# Patient Record
Sex: Female | Born: 1935 | Race: White | Hispanic: No
Health system: Southern US, Community
[De-identification: ages and names within clinical notes are randomized; demographics above are authoritative.]

---

## 2004-12-14 ENCOUNTER — Ambulatory Visit: Payer: Self-pay | Admitting: Internal Medicine

## 2005-12-22 ENCOUNTER — Ambulatory Visit: Payer: Self-pay | Admitting: Internal Medicine

## 2006-06-06 ENCOUNTER — Ambulatory Visit: Payer: Self-pay | Admitting: Unknown Physician Specialty

## 2006-12-24 ENCOUNTER — Ambulatory Visit: Payer: Self-pay | Admitting: Internal Medicine

## 2007-01-26 ENCOUNTER — Emergency Department: Payer: Self-pay | Admitting: Emergency Medicine

## 2008-08-20 ENCOUNTER — Ambulatory Visit: Payer: Self-pay | Admitting: Internal Medicine

## 2008-08-25 ENCOUNTER — Ambulatory Visit: Payer: Self-pay | Admitting: Internal Medicine

## 2008-09-08 ENCOUNTER — Ambulatory Visit: Payer: Self-pay | Admitting: Internal Medicine

## 2009-09-02 ENCOUNTER — Ambulatory Visit: Payer: Self-pay | Admitting: Internal Medicine

## 2009-12-15 ENCOUNTER — Ambulatory Visit: Payer: Self-pay | Admitting: Obstetrics & Gynecology

## 2009-12-23 ENCOUNTER — Ambulatory Visit: Payer: Self-pay | Admitting: Obstetrics & Gynecology

## 2009-12-28 ENCOUNTER — Ambulatory Visit: Payer: Self-pay | Admitting: Gynecologic Oncology

## 2010-01-11 ENCOUNTER — Ambulatory Visit: Payer: Self-pay | Admitting: Gynecologic Oncology

## 2010-01-28 ENCOUNTER — Ambulatory Visit: Payer: Self-pay | Admitting: Gynecologic Oncology

## 2010-01-31 ENCOUNTER — Ambulatory Visit: Payer: Self-pay | Admitting: Obstetrics & Gynecology

## 2010-02-08 ENCOUNTER — Inpatient Hospital Stay: Payer: Self-pay | Admitting: Obstetrics & Gynecology

## 2010-02-22 ENCOUNTER — Ambulatory Visit: Payer: Self-pay | Admitting: Gynecologic Oncology

## 2010-02-27 ENCOUNTER — Ambulatory Visit: Payer: Self-pay | Admitting: Gynecologic Oncology

## 2010-02-27 ENCOUNTER — Ambulatory Visit: Payer: Self-pay | Admitting: Internal Medicine

## 2010-03-08 ENCOUNTER — Ambulatory Visit: Payer: Self-pay | Admitting: Vascular Surgery

## 2010-03-30 ENCOUNTER — Ambulatory Visit: Payer: Self-pay | Admitting: Internal Medicine

## 2010-03-30 ENCOUNTER — Ambulatory Visit: Payer: Self-pay | Admitting: Gynecologic Oncology

## 2010-04-29 ENCOUNTER — Ambulatory Visit: Payer: Self-pay | Admitting: Internal Medicine

## 2010-04-29 ENCOUNTER — Ambulatory Visit: Payer: Self-pay | Admitting: Gynecologic Oncology

## 2010-05-30 ENCOUNTER — Ambulatory Visit: Payer: Self-pay | Admitting: Gynecologic Oncology

## 2010-05-30 ENCOUNTER — Ambulatory Visit: Payer: Self-pay | Admitting: Internal Medicine

## 2010-06-30 ENCOUNTER — Ambulatory Visit: Payer: Self-pay | Admitting: Gynecologic Oncology

## 2010-06-30 ENCOUNTER — Ambulatory Visit: Payer: Self-pay | Admitting: Internal Medicine

## 2010-07-20 LAB — CA 125: CA 125: 12 U/mL (ref 0.0–34.0)

## 2010-07-30 ENCOUNTER — Ambulatory Visit: Payer: Self-pay | Admitting: Gynecologic Oncology

## 2010-07-30 ENCOUNTER — Ambulatory Visit: Payer: Self-pay | Admitting: Internal Medicine

## 2010-08-30 ENCOUNTER — Ambulatory Visit: Payer: Self-pay | Admitting: Internal Medicine

## 2010-08-30 ENCOUNTER — Ambulatory Visit: Payer: Self-pay | Admitting: Gynecologic Oncology

## 2010-09-29 ENCOUNTER — Ambulatory Visit: Payer: Self-pay | Admitting: Internal Medicine

## 2010-09-29 ENCOUNTER — Ambulatory Visit: Payer: Self-pay | Admitting: Gynecologic Oncology

## 2010-10-07 ENCOUNTER — Ambulatory Visit: Payer: Self-pay | Admitting: Internal Medicine

## 2010-10-30 ENCOUNTER — Ambulatory Visit: Payer: Self-pay | Admitting: Gynecologic Oncology

## 2010-10-30 ENCOUNTER — Ambulatory Visit: Payer: Self-pay | Admitting: Internal Medicine

## 2010-11-30 ENCOUNTER — Ambulatory Visit: Payer: Self-pay | Admitting: Internal Medicine

## 2010-11-30 ENCOUNTER — Ambulatory Visit: Payer: Self-pay | Admitting: Gynecologic Oncology

## 2010-12-29 ENCOUNTER — Ambulatory Visit: Payer: Self-pay | Admitting: Internal Medicine

## 2010-12-29 ENCOUNTER — Ambulatory Visit: Payer: Self-pay | Admitting: Gynecologic Oncology

## 2011-01-14 LAB — CA 125: CA 125: 13.1 U/mL (ref 0.0–34.0)

## 2011-01-29 ENCOUNTER — Ambulatory Visit: Payer: Self-pay | Admitting: Gynecologic Oncology

## 2011-01-29 ENCOUNTER — Ambulatory Visit: Payer: Self-pay | Admitting: Internal Medicine

## 2011-02-28 ENCOUNTER — Ambulatory Visit: Payer: Self-pay | Admitting: Gynecologic Oncology

## 2011-02-28 ENCOUNTER — Ambulatory Visit: Payer: Self-pay | Admitting: Internal Medicine

## 2011-03-31 ENCOUNTER — Ambulatory Visit: Payer: Self-pay | Admitting: Gynecologic Oncology

## 2011-05-17 ENCOUNTER — Ambulatory Visit: Payer: Self-pay | Admitting: Internal Medicine

## 2011-05-18 LAB — CA 125: CA 125: 11.8 U/mL (ref 0.0–34.0)

## 2011-05-31 ENCOUNTER — Ambulatory Visit: Payer: Self-pay | Admitting: Internal Medicine

## 2011-07-11 ENCOUNTER — Ambulatory Visit: Payer: Self-pay | Admitting: Internal Medicine

## 2011-07-31 ENCOUNTER — Ambulatory Visit: Payer: Self-pay | Admitting: Internal Medicine

## 2011-10-05 ENCOUNTER — Ambulatory Visit: Payer: Self-pay | Admitting: Internal Medicine

## 2011-10-31 ENCOUNTER — Ambulatory Visit: Payer: Self-pay | Admitting: Internal Medicine

## 2011-11-22 LAB — COMPREHENSIVE METABOLIC PANEL
Albumin: 3.4 g/dL (ref 3.4–5.0)
Alkaline Phosphatase: 108 U/L (ref 50–136)
Anion Gap: 7 (ref 7–16)
Calcium, Total: 8.9 mg/dL (ref 8.5–10.1)
Chloride: 105 mmol/L (ref 98–107)
Co2: 30 mmol/L (ref 21–32)
Creatinine: 0.91 mg/dL (ref 0.60–1.30)
Glucose: 150 mg/dL — ABNORMAL HIGH (ref 65–99)
SGOT(AST): 16 U/L (ref 15–37)
Sodium: 142 mmol/L (ref 136–145)
Total Protein: 6.6 g/dL (ref 6.4–8.2)

## 2011-11-22 LAB — CBC CANCER CENTER
Basophil %: 0.5 %
Eosinophil #: 0.1 x10 3/mm (ref 0.0–0.7)
Eosinophil %: 1.9 %
HGB: 12.8 g/dL (ref 12.0–16.0)
MCH: 30.4 pg (ref 26.0–34.0)
MCV: 88 fL (ref 80–100)
Monocyte #: 0.4 x10 3/mm (ref 0.0–0.7)
Monocyte %: 10.1 %
Neutrophil %: 67.6 %
Platelet: 196 x10 3/mm (ref 150–440)
RBC: 4.21 10*6/uL (ref 3.80–5.20)
WBC: 3.5 x10 3/mm — ABNORMAL LOW (ref 3.6–11.0)

## 2011-11-24 LAB — CA 125: CA 125: 10.5 U/mL (ref 0.0–34.0)

## 2011-12-01 ENCOUNTER — Ambulatory Visit: Payer: Self-pay | Admitting: Internal Medicine

## 2012-01-05 ENCOUNTER — Ambulatory Visit: Payer: Self-pay | Admitting: Internal Medicine

## 2012-01-05 ENCOUNTER — Ambulatory Visit: Payer: Self-pay

## 2012-01-25 ENCOUNTER — Other Ambulatory Visit: Payer: Self-pay | Admitting: Diagnostic Radiology

## 2012-01-25 LAB — CREATININE, SERUM
Creatinine: 0.88 mg/dL (ref 0.60–1.30)
EGFR (African American): >60
EGFR (Non-African Amer.): >60

## 2012-01-29 ENCOUNTER — Ambulatory Visit: Payer: Self-pay | Admitting: Internal Medicine

## 2012-02-07 LAB — CA 125: CA 125: 14.6 U/mL (ref 0.0–34.0)

## 2012-02-28 ENCOUNTER — Ambulatory Visit: Payer: Self-pay | Admitting: Radiation Oncology

## 2012-02-28 ENCOUNTER — Ambulatory Visit: Payer: Self-pay | Admitting: Internal Medicine

## 2012-03-30 ENCOUNTER — Ambulatory Visit: Payer: Self-pay | Admitting: Internal Medicine

## 2012-03-30 ENCOUNTER — Ambulatory Visit: Payer: Self-pay

## 2012-05-10 ENCOUNTER — Ambulatory Visit: Payer: Self-pay | Admitting: Internal Medicine

## 2012-05-15 LAB — CA 125: CA 125: 16.2 U/mL (ref 0.0–34.0)

## 2012-05-30 ENCOUNTER — Ambulatory Visit: Payer: Self-pay | Admitting: Internal Medicine

## 2012-06-30 ENCOUNTER — Ambulatory Visit: Payer: Self-pay | Admitting: Internal Medicine

## 2012-07-04 ENCOUNTER — Ambulatory Visit: Payer: Self-pay | Admitting: Unknown Physician Specialty

## 2012-08-13 ENCOUNTER — Ambulatory Visit: Payer: Self-pay | Admitting: Internal Medicine

## 2012-08-14 LAB — CA 125: CA 125: 16 U/mL (ref 0.0–34.0)

## 2012-08-21 ENCOUNTER — Ambulatory Visit: Payer: Self-pay | Admitting: Vascular Surgery

## 2012-08-30 ENCOUNTER — Ambulatory Visit: Payer: Self-pay | Admitting: Internal Medicine

## 2012-09-29 ENCOUNTER — Ambulatory Visit: Payer: Self-pay | Admitting: Internal Medicine

## 2012-12-16 ENCOUNTER — Ambulatory Visit: Payer: Self-pay | Admitting: Gynecologic Oncology

## 2012-12-28 ENCOUNTER — Ambulatory Visit: Payer: Self-pay | Admitting: Gynecologic Oncology

## 2013-03-05 ENCOUNTER — Ambulatory Visit: Payer: Self-pay | Admitting: Family Medicine

## 2013-03-17 ENCOUNTER — Ambulatory Visit: Payer: Self-pay | Admitting: Gynecologic Oncology

## 2013-03-19 LAB — CA 125: CA 125: 19.9 U/mL (ref 0.0–34.0)

## 2013-03-30 ENCOUNTER — Ambulatory Visit: Payer: Self-pay | Admitting: Gynecologic Oncology

## 2013-09-23 ENCOUNTER — Ambulatory Visit: Payer: Self-pay | Admitting: Gynecologic Oncology

## 2013-09-29 ENCOUNTER — Ambulatory Visit: Payer: Self-pay | Admitting: Gynecologic Oncology

## 2013-10-30 ENCOUNTER — Ambulatory Visit: Payer: Self-pay | Admitting: Internal Medicine

## 2013-11-16 ENCOUNTER — Emergency Department: Payer: Self-pay | Admitting: Emergency Medicine

## 2013-11-16 LAB — TROPONIN I: Troponin-I: <0.02 ng/mL

## 2013-11-16 LAB — CBC WITH DIFFERENTIAL/PLATELET
Basophil #: 0.1 10*3/uL (ref 0.0–0.1)
Basophil %: 0.5 %
EOS PCT: 0.3 %
Eosinophil #: 0 10*3/uL (ref 0.0–0.7)
HCT: 42.7 % (ref 35.0–47.0)
HGB: 14.7 g/dL (ref 12.0–16.0)
LYMPHS ABS: 0.5 10*3/uL — AB (ref 1.0–3.6)
LYMPHS PCT: 4.9 %
MCH: 29.7 pg (ref 26.0–34.0)
MCHC: 34.4 g/dL (ref 32.0–36.0)
MCV: 86 fL (ref 80–100)
MONO ABS: 0.7 x10 3/mm (ref 0.2–0.9)
MONOS PCT: 6.4 %
NEUTROS PCT: 87.9 %
Neutrophil #: 9.4 10*3/uL — ABNORMAL HIGH (ref 1.4–6.5)
Platelet: 217 10*3/uL (ref 150–440)
RBC: 4.95 10*6/uL (ref 3.80–5.20)
RDW: 13.6 % (ref 11.5–14.5)
WBC: 10.7 10*3/uL (ref 3.6–11.0)

## 2013-11-16 LAB — COMPREHENSIVE METABOLIC PANEL
ANION GAP: 7 (ref 7–16)
Albumin: 3.6 g/dL (ref 3.4–5.0)
Alkaline Phosphatase: 99 U/L
BUN: 15 mg/dL (ref 7–18)
Bilirubin,Total: 0.8 mg/dL (ref 0.2–1.0)
CALCIUM: 8.9 mg/dL (ref 8.5–10.1)
Chloride: 103 mmol/L (ref 98–107)
Co2: 25 mmol/L (ref 21–32)
Creatinine: 0.78 mg/dL (ref 0.60–1.30)
EGFR (African American): >60
Glucose: 127 mg/dL — ABNORMAL HIGH (ref 65–99)
OSMOLALITY: 273 (ref 275–301)
Potassium: 3.5 mmol/L (ref 3.5–5.1)
SGOT(AST): 27 U/L (ref 15–37)
SGPT (ALT): 20 U/L (ref 12–78)
Sodium: 135 mmol/L — ABNORMAL LOW (ref 136–145)
TOTAL PROTEIN: 7.2 g/dL (ref 6.4–8.2)

## 2013-11-16 LAB — URINALYSIS, COMPLETE
Glucose,UR: 50 mg/dL (ref 0–75)
Hyaline Cast: 18
Leukocyte Esterase: NEGATIVE
Nitrite: NEGATIVE
PH: 6 (ref 4.5–8.0)
RBC,UR: 2 /HPF (ref 0–5)
SPECIFIC GRAVITY: 1.035 (ref 1.003–1.030)
Squamous Epithelial: 2
WBC UR: 7 /HPF (ref 0–5)

## 2013-11-18 LAB — URINALYSIS, COMPLETE
Bilirubin,UR: NEGATIVE
GLUCOSE, UR: NEGATIVE mg/dL (ref 0–75)
Nitrite: NEGATIVE
Ph: 5 (ref 4.5–8.0)
Protein: 30
Specific Gravity: 1.023 (ref 1.003–1.030)
Squamous Epithelial: 1
WBC UR: 3 /HPF (ref 0–5)

## 2013-11-18 LAB — CBC WITH DIFFERENTIAL/PLATELET
BASOS ABS: 0 10*3/uL (ref 0.0–0.1)
Basophil %: 0.4 %
EOS ABS: 0 10*3/uL (ref 0.0–0.7)
EOS PCT: 0.2 %
HCT: 44.4 % (ref 35.0–47.0)
HGB: 14.9 g/dL (ref 12.0–16.0)
LYMPHS PCT: 9.8 %
Lymphocyte #: 0.9 10*3/uL — ABNORMAL LOW (ref 1.0–3.6)
MCH: 29.5 pg (ref 26.0–34.0)
MCHC: 33.6 g/dL (ref 32.0–36.0)
MCV: 88 fL (ref 80–100)
Monocyte #: 0.8 x10 3/mm (ref 0.2–0.9)
Monocyte %: 8.5 %
Neutrophil #: 7.3 10*3/uL — ABNORMAL HIGH (ref 1.4–6.5)
Neutrophil %: 81.1 %
Platelet: 220 10*3/uL (ref 150–440)
RBC: 5.06 10*6/uL (ref 3.80–5.20)
RDW: 14 % (ref 11.5–14.5)
WBC: 9 10*3/uL (ref 3.6–11.0)

## 2013-11-18 LAB — LIPASE, BLOOD: Lipase: 70 U/L — ABNORMAL LOW (ref 73–393)

## 2013-11-18 LAB — COMPREHENSIVE METABOLIC PANEL
ALK PHOS: 105 U/L
ANION GAP: 7 (ref 7–16)
AST: 21 U/L (ref 15–37)
Albumin: 3.8 g/dL (ref 3.4–5.0)
BILIRUBIN TOTAL: 0.7 mg/dL (ref 0.2–1.0)
BUN: 11 mg/dL (ref 7–18)
CO2: 30 mmol/L (ref 21–32)
CREATININE: 0.78 mg/dL (ref 0.60–1.30)
Calcium, Total: 9.8 mg/dL (ref 8.5–10.1)
Chloride: 99 mmol/L (ref 98–107)
EGFR (Non-African Amer.): >60
Glucose: 135 mg/dL — ABNORMAL HIGH (ref 65–99)
Osmolality: 273 (ref 275–301)
POTASSIUM: 4 mmol/L (ref 3.5–5.1)
SGPT (ALT): 17 U/L (ref 12–78)
SODIUM: 136 mmol/L (ref 136–145)
Total Protein: 7.8 g/dL (ref 6.4–8.2)

## 2013-11-20 ENCOUNTER — Inpatient Hospital Stay: Payer: Self-pay | Admitting: Family Medicine

## 2013-11-20 LAB — CBC WITH DIFFERENTIAL/PLATELET
BASOS PCT: 0.4 %
Basophil #: 0 10*3/uL (ref 0.0–0.1)
EOS ABS: 0 10*3/uL (ref 0.0–0.7)
EOS PCT: 0.5 %
HCT: 37.6 % (ref 35.0–47.0)
HGB: 12.7 g/dL (ref 12.0–16.0)
Lymphocyte #: 0.8 10*3/uL — ABNORMAL LOW (ref 1.0–3.6)
Lymphocyte %: 18.5 %
MCH: 29.5 pg (ref 26.0–34.0)
MCHC: 33.7 g/dL (ref 32.0–36.0)
MCV: 88 fL (ref 80–100)
MONOS PCT: 13.3 %
Monocyte #: 0.6 x10 3/mm (ref 0.2–0.9)
Neutrophil #: 3 10*3/uL (ref 1.4–6.5)
Neutrophil %: 67.3 %
PLATELETS: 173 10*3/uL (ref 150–440)
RBC: 4.3 10*6/uL (ref 3.80–5.20)
RDW: 13.8 % (ref 11.5–14.5)
WBC: 4.5 10*3/uL (ref 3.6–11.0)

## 2013-11-20 LAB — BASIC METABOLIC PANEL
ANION GAP: 7 (ref 7–16)
BUN: 10 mg/dL (ref 7–18)
Calcium, Total: 8.6 mg/dL (ref 8.5–10.1)
Chloride: 103 mmol/L (ref 98–107)
Co2: 26 mmol/L (ref 21–32)
Creatinine: 0.62 mg/dL (ref 0.60–1.30)
EGFR (African American): >60
Glucose: 56 mg/dL — ABNORMAL LOW (ref 65–99)
Osmolality: 269 (ref 275–301)
Potassium: 3.6 mmol/L (ref 3.5–5.1)
Sodium: 136 mmol/L (ref 136–145)

## 2013-11-20 LAB — MAGNESIUM: Magnesium: 1.8 mg/dL

## 2013-11-20 LAB — CEA: CEA: 3.3 ng/mL (ref 0.0–4.7)

## 2013-11-22 LAB — CBC WITH DIFFERENTIAL/PLATELET
BASOS PCT: 0.2 %
Basophil #: 0 10*3/uL (ref 0.0–0.1)
EOS ABS: 0 10*3/uL (ref 0.0–0.7)
EOS PCT: 0 %
HCT: 38.7 % (ref 35.0–47.0)
HGB: 13 g/dL (ref 12.0–16.0)
LYMPHS ABS: 0.5 10*3/uL — AB (ref 1.0–3.6)
Lymphocyte %: 6.3 %
MCH: 29.5 pg (ref 26.0–34.0)
MCHC: 33.6 g/dL (ref 32.0–36.0)
MCV: 88 fL (ref 80–100)
Monocyte #: 0.9 x10 3/mm (ref 0.2–0.9)
Monocyte %: 10.3 %
NEUTROS ABS: 6.9 10*3/uL — AB (ref 1.4–6.5)
Neutrophil %: 83.2 %
Platelet: 179 10*3/uL (ref 150–440)
RBC: 4.41 10*6/uL (ref 3.80–5.20)
RDW: 13.8 % (ref 11.5–14.5)
WBC: 8.3 10*3/uL (ref 3.6–11.0)

## 2013-11-22 LAB — BASIC METABOLIC PANEL
ANION GAP: 3 — AB (ref 7–16)
BUN: 7 mg/dL (ref 7–18)
CO2: 29 mmol/L (ref 21–32)
Calcium, Total: 8.5 mg/dL (ref 8.5–10.1)
Chloride: 103 mmol/L (ref 98–107)
Creatinine: 0.81 mg/dL (ref 0.60–1.30)
EGFR (African American): >60
EGFR (Non-African Amer.): >60
Glucose: 204 mg/dL — ABNORMAL HIGH (ref 65–99)
Osmolality: 274 (ref 275–301)
Potassium: 4.2 mmol/L (ref 3.5–5.1)
Sodium: 135 mmol/L — ABNORMAL LOW (ref 136–145)

## 2013-11-22 LAB — CA 125: CA 125: 113.7 U/mL — ABNORMAL HIGH (ref 0.0–34.0)

## 2013-11-23 LAB — CBC WITH DIFFERENTIAL/PLATELET
Basophil #: 0 10*3/uL (ref 0.0–0.1)
Basophil %: 0.3 %
EOS ABS: 0 10*3/uL (ref 0.0–0.7)
Eosinophil %: 0.6 %
HCT: 36.1 % (ref 35.0–47.0)
HGB: 12.2 g/dL (ref 12.0–16.0)
Lymphocyte #: 0.7 10*3/uL — ABNORMAL LOW (ref 1.0–3.6)
Lymphocyte %: 13.1 %
MCH: 29.7 pg (ref 26.0–34.0)
MCHC: 33.8 g/dL (ref 32.0–36.0)
MCV: 88 fL (ref 80–100)
Monocyte #: 0.6 x10 3/mm (ref 0.2–0.9)
Monocyte %: 12.3 %
Neutrophil #: 3.7 10*3/uL (ref 1.4–6.5)
Neutrophil %: 73.7 %
Platelet: 176 10*3/uL (ref 150–440)
RBC: 4.12 10*6/uL (ref 3.80–5.20)
RDW: 14.2 % (ref 11.5–14.5)
WBC: 5 10*3/uL (ref 3.6–11.0)

## 2013-11-23 LAB — BASIC METABOLIC PANEL
ANION GAP: 3 — AB (ref 7–16)
BUN: 6 mg/dL — ABNORMAL LOW (ref 7–18)
CO2: 29 mmol/L (ref 21–32)
Calcium, Total: 8.6 mg/dL (ref 8.5–10.1)
Chloride: 102 mmol/L (ref 98–107)
Creatinine: 0.72 mg/dL (ref 0.60–1.30)
EGFR (Non-African Amer.): >60
GLUCOSE: 165 mg/dL — AB (ref 65–99)
Osmolality: 270 (ref 275–301)
Potassium: 4 mmol/L (ref 3.5–5.1)
Sodium: 134 mmol/L — ABNORMAL LOW (ref 136–145)

## 2013-11-25 LAB — CBC WITH DIFFERENTIAL/PLATELET
BASOS ABS: 0 10*3/uL (ref 0.0–0.1)
BASOS PCT: 0.1 %
Eosinophil #: 0.1 10*3/uL (ref 0.0–0.7)
Eosinophil %: 2.7 %
HCT: 38.2 % (ref 35.0–47.0)
HGB: 12.8 g/dL (ref 12.0–16.0)
Lymphocyte #: 0.4 10*3/uL — ABNORMAL LOW (ref 1.0–3.6)
Lymphocyte %: 7.1 %
MCH: 29.7 pg (ref 26.0–34.0)
MCHC: 33.5 g/dL (ref 32.0–36.0)
MCV: 89 fL (ref 80–100)
MONO ABS: 0.6 x10 3/mm (ref 0.2–0.9)
Monocyte %: 10.4 %
NEUTROS PCT: 79.7 %
Neutrophil #: 4.3 10*3/uL (ref 1.4–6.5)
Platelet: 199 10*3/uL (ref 150–440)
RBC: 4.31 10*6/uL (ref 3.80–5.20)
RDW: 14 % (ref 11.5–14.5)
WBC: 5.5 10*3/uL (ref 3.6–11.0)

## 2013-11-25 LAB — BASIC METABOLIC PANEL
Anion Gap: 2 — ABNORMAL LOW (ref 7–16)
BUN: 7 mg/dL (ref 7–18)
CREATININE: 0.74 mg/dL (ref 0.60–1.30)
Calcium, Total: 8.6 mg/dL (ref 8.5–10.1)
Chloride: 99 mmol/L (ref 98–107)
Co2: 32 mmol/L (ref 21–32)
EGFR (Non-African Amer.): >60
Glucose: 169 mg/dL — ABNORMAL HIGH (ref 65–99)
OSMOLALITY: 268 (ref 275–301)
Potassium: 3.7 mmol/L (ref 3.5–5.1)
Sodium: 133 mmol/L — ABNORMAL LOW (ref 136–145)

## 2013-11-26 LAB — BASIC METABOLIC PANEL
Anion Gap: 3 — ABNORMAL LOW (ref 7–16)
BUN: 6 mg/dL — ABNORMAL LOW (ref 7–18)
CALCIUM: 9.1 mg/dL (ref 8.5–10.1)
CHLORIDE: 101 mmol/L (ref 98–107)
Co2: 30 mmol/L (ref 21–32)
Creatinine: 0.67 mg/dL (ref 0.60–1.30)
EGFR (African American): >60
Glucose: 159 mg/dL — ABNORMAL HIGH (ref 65–99)
OSMOLALITY: 269 (ref 275–301)
Potassium: 3.9 mmol/L (ref 3.5–5.1)
Sodium: 134 mmol/L — ABNORMAL LOW (ref 136–145)

## 2013-11-28 LAB — CBC WITH DIFFERENTIAL/PLATELET
Basophil #: 0 10*3/uL (ref 0.0–0.1)
Basophil %: 0.5 %
Eosinophil #: 0.2 10*3/uL (ref 0.0–0.7)
Eosinophil %: 2.9 %
HCT: 38.7 % (ref 35.0–47.0)
HGB: 13.2 g/dL (ref 12.0–16.0)
Lymphocyte #: 0.7 10*3/uL — ABNORMAL LOW (ref 1.0–3.6)
Lymphocyte %: 12.9 %
MCH: 29.8 pg (ref 26.0–34.0)
MCHC: 34 g/dL (ref 32.0–36.0)
MCV: 88 fL (ref 80–100)
Monocyte #: 0.6 x10 3/mm (ref 0.2–0.9)
Monocyte %: 12 %
NEUTROS ABS: 3.9 10*3/uL (ref 1.4–6.5)
Neutrophil %: 71.7 %
Platelet: 204 10*3/uL (ref 150–440)
RBC: 4.41 10*6/uL (ref 3.80–5.20)
RDW: 13.6 % (ref 11.5–14.5)
WBC: 5.4 10*3/uL (ref 3.6–11.0)

## 2013-11-28 LAB — BASIC METABOLIC PANEL
ANION GAP: 5 — AB (ref 7–16)
BUN: 12 mg/dL (ref 7–18)
Calcium, Total: 9.3 mg/dL (ref 8.5–10.1)
Chloride: 99 mmol/L (ref 98–107)
Co2: 29 mmol/L (ref 21–32)
Creatinine: 0.74 mg/dL (ref 0.60–1.30)
EGFR (African American): >60
GLUCOSE: 171 mg/dL — AB (ref 65–99)
Osmolality: 270 (ref 275–301)
Potassium: 3.8 mmol/L (ref 3.5–5.1)
Sodium: 133 mmol/L — ABNORMAL LOW (ref 136–145)

## 2013-12-04 ENCOUNTER — Ambulatory Visit: Payer: Self-pay | Admitting: Internal Medicine

## 2013-12-09 ENCOUNTER — Ambulatory Visit: Payer: Self-pay | Admitting: Surgery

## 2013-12-11 LAB — CBC CANCER CENTER
Basophil #: 0 x10 3/mm (ref 0.0–0.1)
Basophil %: 0.3 %
EOS ABS: 0.1 x10 3/mm (ref 0.0–0.7)
Eosinophil %: 1.9 %
HCT: 36.4 % (ref 35.0–47.0)
HGB: 11.8 g/dL — AB (ref 12.0–16.0)
LYMPHS ABS: 0.6 x10 3/mm — AB (ref 1.0–3.6)
Lymphocyte %: 13.3 %
MCH: 28.6 pg (ref 26.0–34.0)
MCHC: 32.5 g/dL (ref 32.0–36.0)
MCV: 88 fL (ref 80–100)
MONOS PCT: 8 %
Monocyte #: 0.4 x10 3/mm (ref 0.2–0.9)
Neutrophil #: 3.6 x10 3/mm (ref 1.4–6.5)
Neutrophil %: 76.5 %
PLATELETS: 218 x10 3/mm (ref 150–440)
RBC: 4.14 10*6/uL (ref 3.80–5.20)
RDW: 14.2 % (ref 11.5–14.5)
WBC: 4.7 x10 3/mm (ref 3.6–11.0)

## 2013-12-11 LAB — COMPREHENSIVE METABOLIC PANEL
ALBUMIN: 2.8 g/dL — AB (ref 3.4–5.0)
ALK PHOS: 99 U/L
ANION GAP: 9 (ref 7–16)
AST: 20 U/L (ref 15–37)
BUN: 10 mg/dL (ref 7–18)
Bilirubin,Total: 0.3 mg/dL (ref 0.2–1.0)
CALCIUM: 8.2 mg/dL — AB (ref 8.5–10.1)
CO2: 29 mmol/L (ref 21–32)
Chloride: 103 mmol/L (ref 98–107)
Creatinine: 0.86 mg/dL (ref 0.60–1.30)
EGFR (Non-African Amer.): >60
Glucose: 245 mg/dL — ABNORMAL HIGH (ref 65–99)
Osmolality: 288 (ref 275–301)
Potassium: 3.6 mmol/L (ref 3.5–5.1)
SGPT (ALT): 21 U/L (ref 12–78)
SODIUM: 141 mmol/L (ref 136–145)
Total Protein: 6.4 g/dL (ref 6.4–8.2)

## 2013-12-11 LAB — MAGNESIUM: Magnesium: 1.8 mg/dL

## 2013-12-15 LAB — CBC CANCER CENTER
BASOS ABS: 0 x10 3/mm (ref 0.0–0.1)
BASOS PCT: 0.5 %
Eosinophil #: 0.1 x10 3/mm (ref 0.0–0.7)
Eosinophil %: 2.2 %
HCT: 36.3 % (ref 35.0–47.0)
HGB: 11.9 g/dL — AB (ref 12.0–16.0)
LYMPHS ABS: 0.8 x10 3/mm — AB (ref 1.0–3.6)
Lymphocyte %: 17 %
MCH: 28.9 pg (ref 26.0–34.0)
MCHC: 32.9 g/dL (ref 32.0–36.0)
MCV: 88 fL (ref 80–100)
MONO ABS: 0.4 x10 3/mm (ref 0.2–0.9)
Monocyte %: 8.4 %
NEUTROS ABS: 3.3 x10 3/mm (ref 1.4–6.5)
Neutrophil %: 71.9 %
Platelet: 230 x10 3/mm (ref 150–440)
RBC: 4.13 10*6/uL (ref 3.80–5.20)
RDW: 14.6 % — AB (ref 11.5–14.5)
WBC: 4.6 x10 3/mm (ref 3.6–11.0)

## 2013-12-15 LAB — CREATININE, SERUM
Creatinine: 0.85 mg/dL (ref 0.60–1.30)
EGFR (Non-African Amer.): >60

## 2013-12-22 LAB — CBC CANCER CENTER
BASOS ABS: 0 x10 3/mm (ref 0.0–0.1)
Basophil %: 0.9 %
EOS ABS: 0 x10 3/mm (ref 0.0–0.7)
EOS PCT: 1.2 %
HCT: 37.7 % (ref 35.0–47.0)
HGB: 12.3 g/dL (ref 12.0–16.0)
Lymphocyte #: 0.6 x10 3/mm — ABNORMAL LOW (ref 1.0–3.6)
Lymphocyte %: 17.5 %
MCH: 28.5 pg (ref 26.0–34.0)
MCHC: 32.6 g/dL (ref 32.0–36.0)
MCV: 88 fL (ref 80–100)
MONO ABS: 0.1 x10 3/mm — AB (ref 0.2–0.9)
MONOS PCT: 3.7 %
NEUTROS PCT: 76.7 %
Neutrophil #: 2.7 x10 3/mm (ref 1.4–6.5)
Platelet: 189 x10 3/mm (ref 150–440)
RBC: 4.3 10*6/uL (ref 3.80–5.20)
RDW: 13.8 % (ref 11.5–14.5)
WBC: 3.5 x10 3/mm — ABNORMAL LOW (ref 3.6–11.0)

## 2013-12-22 LAB — COMPREHENSIVE METABOLIC PANEL
ALK PHOS: 103 U/L
Albumin: 3.4 g/dL (ref 3.4–5.0)
Anion Gap: 10 (ref 7–16)
BUN: 21 mg/dL — AB (ref 7–18)
Bilirubin,Total: 0.3 mg/dL (ref 0.2–1.0)
CHLORIDE: 98 mmol/L (ref 98–107)
Calcium, Total: 8.9 mg/dL (ref 8.5–10.1)
Co2: 29 mmol/L (ref 21–32)
Creatinine: 1.02 mg/dL (ref 0.60–1.30)
EGFR (African American): >60
GFR CALC NON AF AMER: 53 — AB
Glucose: 249 mg/dL — ABNORMAL HIGH (ref 65–99)
OSMOLALITY: 285 (ref 275–301)
POTASSIUM: 3.6 mmol/L (ref 3.5–5.1)
SGOT(AST): 27 U/L (ref 15–37)
SGPT (ALT): 27 U/L (ref 12–78)
Sodium: 137 mmol/L (ref 136–145)
Total Protein: 6.8 g/dL (ref 6.4–8.2)

## 2013-12-22 LAB — MAGNESIUM: MAGNESIUM: 1.4 mg/dL — AB

## 2013-12-28 ENCOUNTER — Ambulatory Visit: Payer: Self-pay | Admitting: Internal Medicine

## 2013-12-29 LAB — BASIC METABOLIC PANEL
Anion Gap: 8 (ref 7–16)
BUN: 12 mg/dL (ref 7–18)
CHLORIDE: 101 mmol/L (ref 98–107)
CO2: 29 mmol/L (ref 21–32)
Calcium, Total: 8.4 mg/dL — ABNORMAL LOW (ref 8.5–10.1)
Creatinine: 0.99 mg/dL (ref 0.60–1.30)
EGFR (Non-African Amer.): 55 — ABNORMAL LOW
GLUCOSE: 243 mg/dL — AB (ref 65–99)
Osmolality: 283 (ref 275–301)
Potassium: 3.7 mmol/L (ref 3.5–5.1)
Sodium: 138 mmol/L (ref 136–145)

## 2013-12-29 LAB — CBC CANCER CENTER
Basophil #: 0 x10 3/mm (ref 0.0–0.1)
Basophil %: 0.8 %
EOS ABS: 0 x10 3/mm (ref 0.0–0.7)
EOS PCT: 1.6 %
HCT: 34.1 % — AB (ref 35.0–47.0)
HGB: 11.2 g/dL — ABNORMAL LOW (ref 12.0–16.0)
Lymphocyte #: 0.7 x10 3/mm — ABNORMAL LOW (ref 1.0–3.6)
Lymphocyte %: 48.7 %
MCH: 28.5 pg (ref 26.0–34.0)
MCHC: 32.8 g/dL (ref 32.0–36.0)
MCV: 87 fL (ref 80–100)
Monocyte #: 0.2 x10 3/mm (ref 0.2–0.9)
Monocyte %: 17.2 %
NEUTROS ABS: 0.4 x10 3/mm — AB (ref 1.4–6.5)
Neutrophil %: 31.7 %
PLATELETS: 168 x10 3/mm (ref 150–440)
RBC: 3.92 10*6/uL (ref 3.80–5.20)
RDW: 13.8 % (ref 11.5–14.5)
WBC: 1.4 x10 3/mm — AB (ref 3.6–11.0)

## 2013-12-29 LAB — MAGNESIUM: MAGNESIUM: 1.5 mg/dL — AB

## 2014-01-01 LAB — CBC CANCER CENTER
BASOS ABS: 0 x10 3/mm (ref 0.0–0.1)
BASOS PCT: 0.6 %
Eosinophil #: 0 x10 3/mm (ref 0.0–0.7)
Eosinophil %: 0.6 %
HCT: 35.2 % (ref 35.0–47.0)
HGB: 11.4 g/dL — ABNORMAL LOW (ref 12.0–16.0)
LYMPHS ABS: 0.5 x10 3/mm — AB (ref 1.0–3.6)
Lymphocyte %: 20.9 %
MCH: 28.6 pg (ref 26.0–34.0)
MCHC: 32.5 g/dL (ref 32.0–36.0)
MCV: 88 fL (ref 80–100)
MONOS PCT: 16.4 %
Monocyte #: 0.4 x10 3/mm (ref 0.2–0.9)
NEUTROS PCT: 61.5 %
Neutrophil #: 1.5 x10 3/mm (ref 1.4–6.5)
Platelet: 169 x10 3/mm (ref 150–440)
RBC: 4 10*6/uL (ref 3.80–5.20)
RDW: 14 % (ref 11.5–14.5)
WBC: 2.5 x10 3/mm — AB (ref 3.6–11.0)

## 2014-01-01 LAB — POTASSIUM: POTASSIUM: 4 mmol/L (ref 3.5–5.1)

## 2014-01-01 LAB — MAGNESIUM: MAGNESIUM: 1.9 mg/dL

## 2014-01-05 LAB — COMPREHENSIVE METABOLIC PANEL
ALBUMIN: 3.2 g/dL — AB (ref 3.4–5.0)
Alkaline Phosphatase: 90 U/L
Anion Gap: 8 (ref 7–16)
BUN: 14 mg/dL (ref 7–18)
Bilirubin,Total: 0.3 mg/dL (ref 0.2–1.0)
Calcium, Total: 9 mg/dL (ref 8.5–10.1)
Chloride: 103 mmol/L (ref 98–107)
Co2: 28 mmol/L (ref 21–32)
Creatinine: 1.04 mg/dL (ref 0.60–1.30)
EGFR (Non-African Amer.): 52 — ABNORMAL LOW
Glucose: 290 mg/dL — ABNORMAL HIGH (ref 65–99)
Osmolality: 289 (ref 275–301)
POTASSIUM: 4.1 mmol/L (ref 3.5–5.1)
SGOT(AST): 19 U/L (ref 15–37)
SGPT (ALT): 18 U/L (ref 12–78)
Sodium: 139 mmol/L (ref 136–145)
TOTAL PROTEIN: 6.4 g/dL (ref 6.4–8.2)

## 2014-01-05 LAB — CBC CANCER CENTER
Basophil #: 0 x10 3/mm (ref 0.0–0.1)
Basophil %: 0.4 %
EOS ABS: 0 x10 3/mm (ref 0.0–0.7)
Eosinophil %: 0.6 %
HCT: 34.6 % — ABNORMAL LOW (ref 35.0–47.0)
HGB: 11.4 g/dL — AB (ref 12.0–16.0)
Lymphocyte #: 0.6 x10 3/mm — ABNORMAL LOW (ref 1.0–3.6)
Lymphocyte %: 15.2 %
MCH: 29 pg (ref 26.0–34.0)
MCHC: 33 g/dL (ref 32.0–36.0)
MCV: 88 fL (ref 80–100)
Monocyte #: 0.5 x10 3/mm (ref 0.2–0.9)
Monocyte %: 14.4 %
NEUTROS ABS: 2.6 x10 3/mm (ref 1.4–6.5)
Neutrophil %: 69.4 %
PLATELETS: 148 x10 3/mm — AB (ref 150–440)
RBC: 3.93 10*6/uL (ref 3.80–5.20)
RDW: 14.6 % — AB (ref 11.5–14.5)
WBC: 3.7 x10 3/mm (ref 3.6–11.0)

## 2014-01-05 LAB — MAGNESIUM: Magnesium: 1.7 mg/dL — ABNORMAL LOW

## 2014-01-12 LAB — CBC CANCER CENTER
BASOS ABS: 0 x10 3/mm (ref 0.0–0.1)
Basophil %: 0.3 %
EOS ABS: 0 x10 3/mm (ref 0.0–0.7)
EOS PCT: 0.6 %
HCT: 34.3 % — ABNORMAL LOW (ref 35.0–47.0)
HGB: 11.2 g/dL — AB (ref 12.0–16.0)
Lymphocyte #: 0.6 x10 3/mm — ABNORMAL LOW (ref 1.0–3.6)
Lymphocyte %: 18.4 %
MCH: 28.7 pg (ref 26.0–34.0)
MCHC: 32.7 g/dL (ref 32.0–36.0)
MCV: 88 fL (ref 80–100)
MONOS PCT: 3.3 %
Monocyte #: 0.1 x10 3/mm — ABNORMAL LOW (ref 0.2–0.9)
Neutrophil #: 2.7 x10 3/mm (ref 1.4–6.5)
Neutrophil %: 77.4 %
PLATELETS: 139 x10 3/mm — AB (ref 150–440)
RBC: 3.92 10*6/uL (ref 3.80–5.20)
RDW: 14.4 % (ref 11.5–14.5)
WBC: 3.5 x10 3/mm — ABNORMAL LOW (ref 3.6–11.0)

## 2014-01-12 LAB — CREATININE, SERUM
CREATININE: 0.81 mg/dL (ref 0.60–1.30)
EGFR (Non-African Amer.): >60

## 2014-01-12 LAB — MAGNESIUM: Magnesium: 1.5 mg/dL — ABNORMAL LOW

## 2014-01-12 LAB — POTASSIUM: Potassium: 4 mmol/L (ref 3.5–5.1)

## 2014-01-19 LAB — POTASSIUM: Potassium: 3.8 mmol/L (ref 3.5–5.1)

## 2014-01-19 LAB — CBC CANCER CENTER
BASOS ABS: 0 x10 3/mm (ref 0.0–0.1)
BASOS PCT: 0.7 %
Eosinophil #: 0 x10 3/mm (ref 0.0–0.7)
Eosinophil %: 1.1 %
HCT: 30.9 % — ABNORMAL LOW (ref 35.0–47.0)
HGB: 10.3 g/dL — ABNORMAL LOW (ref 12.0–16.0)
LYMPHS ABS: 0.4 x10 3/mm — AB (ref 1.0–3.6)
Lymphocyte %: 36.1 %
MCH: 29.1 pg (ref 26.0–34.0)
MCHC: 33.3 g/dL (ref 32.0–36.0)
MCV: 87 fL (ref 80–100)
Monocyte #: 0.2 x10 3/mm (ref 0.2–0.9)
Monocyte %: 20.6 %
Neutrophil #: 0.4 x10 3/mm — ABNORMAL LOW (ref 1.4–6.5)
Neutrophil %: 41.5 %
PLATELETS: 167 x10 3/mm (ref 150–440)
RBC: 3.54 10*6/uL — ABNORMAL LOW (ref 3.80–5.20)
RDW: 14.7 % — ABNORMAL HIGH (ref 11.5–14.5)
WBC: 1 x10 3/mm — AB (ref 3.6–11.0)

## 2014-01-19 LAB — MAGNESIUM: Magnesium: 1.4 mg/dL — ABNORMAL LOW

## 2014-01-20 LAB — CA 125: CA 125: 109.4 U/mL — AB (ref 0.0–34.0)

## 2014-01-22 LAB — DIFFERENTIAL
BASOS PCT: 0.4 %
Basophil #: 0 10*3/uL (ref 0.0–0.1)
EOS PCT: 0.2 %
Eosinophil #: 0 10*3/uL (ref 0.0–0.7)
LYMPHS ABS: 0.5 10*3/uL — AB (ref 1.0–3.6)
LYMPHS PCT: 24.9 %
MONO ABS: 0.3 x10 3/mm (ref 0.2–0.9)
MONOS PCT: 16.1 %
NEUTROS ABS: 1.3 10*3/uL — AB (ref 1.4–6.5)
Neutrophil %: 58.4 %

## 2014-01-22 LAB — CANCER CENTER WBC: WBC: 2.2 x10 3/mm — ABNORMAL LOW (ref 3.6–11.0)

## 2014-01-26 LAB — COMPREHENSIVE METABOLIC PANEL
ALK PHOS: 87 U/L
ALT: 17 U/L (ref 12–78)
Albumin: 3.2 g/dL — ABNORMAL LOW (ref 3.4–5.0)
Anion Gap: 8 (ref 7–16)
BUN: 16 mg/dL (ref 7–18)
Bilirubin,Total: 0.2 mg/dL (ref 0.2–1.0)
Calcium, Total: 9.1 mg/dL (ref 8.5–10.1)
Chloride: 103 mmol/L (ref 98–107)
Co2: 28 mmol/L (ref 21–32)
Creatinine: 0.83 mg/dL (ref 0.60–1.30)
EGFR (Non-African Amer.): >60
GLUCOSE: 167 mg/dL — AB (ref 65–99)
Osmolality: 283 (ref 275–301)
Potassium: 4 mmol/L (ref 3.5–5.1)
SGOT(AST): 16 U/L (ref 15–37)
SODIUM: 139 mmol/L (ref 136–145)
Total Protein: 6.5 g/dL (ref 6.4–8.2)

## 2014-01-26 LAB — CBC CANCER CENTER
Basophil #: 0 x10 3/mm (ref 0.0–0.1)
Basophil %: 0.6 %
Eosinophil #: 0 x10 3/mm (ref 0.0–0.7)
Eosinophil %: 0.2 %
HCT: 31 % — AB (ref 35.0–47.0)
HGB: 10.3 g/dL — AB (ref 12.0–16.0)
Lymphocyte #: 0.6 x10 3/mm — ABNORMAL LOW (ref 1.0–3.6)
Lymphocyte %: 22 %
MCH: 29.6 pg (ref 26.0–34.0)
MCHC: 33.2 g/dL (ref 32.0–36.0)
MCV: 89 fL (ref 80–100)
MONO ABS: 0.4 x10 3/mm (ref 0.2–0.9)
MONOS PCT: 13.1 %
NEUTROS ABS: 1.8 x10 3/mm (ref 1.4–6.5)
NEUTROS PCT: 64.1 %
PLATELETS: 155 x10 3/mm (ref 150–440)
RBC: 3.48 10*6/uL — ABNORMAL LOW (ref 3.80–5.20)
RDW: 15.2 % — AB (ref 11.5–14.5)
WBC: 2.8 x10 3/mm — ABNORMAL LOW (ref 3.6–11.0)

## 2014-01-26 LAB — MAGNESIUM: Magnesium: 1.8 mg/dL

## 2014-01-28 ENCOUNTER — Ambulatory Visit: Payer: Self-pay | Admitting: Internal Medicine

## 2014-01-28 LAB — CBC CANCER CENTER
BASOS ABS: 0 x10 3/mm (ref 0.0–0.1)
BASOS PCT: 0.4 %
Eosinophil #: 0 x10 3/mm (ref 0.0–0.7)
Eosinophil %: 0.3 %
HCT: 31.1 % — AB (ref 35.0–47.0)
HGB: 10.3 g/dL — ABNORMAL LOW (ref 12.0–16.0)
LYMPHS ABS: 0.5 x10 3/mm — AB (ref 1.0–3.6)
Lymphocyte %: 19.2 %
MCH: 29.4 pg (ref 26.0–34.0)
MCHC: 33 g/dL (ref 32.0–36.0)
MCV: 89 fL (ref 80–100)
MONO ABS: 0.3 x10 3/mm (ref 0.2–0.9)
Monocyte %: 11.1 %
NEUTROS PCT: 69 %
Neutrophil #: 1.9 x10 3/mm (ref 1.4–6.5)
Platelet: 166 x10 3/mm (ref 150–440)
RBC: 3.5 10*6/uL — ABNORMAL LOW (ref 3.80–5.20)
RDW: 17.1 % — AB (ref 11.5–14.5)
WBC: 2.8 x10 3/mm — AB (ref 3.6–11.0)

## 2014-01-28 LAB — CREATININE, SERUM
CREATININE: 0.82 mg/dL (ref 0.60–1.30)
EGFR (African American): >60
EGFR (Non-African Amer.): >60

## 2014-02-04 LAB — MAGNESIUM: Magnesium: 1.4 mg/dL — ABNORMAL LOW

## 2014-02-04 LAB — CREATININE, SERUM
Creatinine: 0.97 mg/dL (ref 0.60–1.30)
EGFR (African American): >60
EGFR (Non-African Amer.): 57 — ABNORMAL LOW

## 2014-02-04 LAB — CBC CANCER CENTER
Basophil #: 0 x10 3/mm (ref 0.0–0.1)
Basophil %: 0.4 %
EOS ABS: 0 x10 3/mm (ref 0.0–0.7)
Eosinophil %: 0.8 %
HCT: 30.8 % — ABNORMAL LOW (ref 35.0–47.0)
HGB: 10.2 g/dL — ABNORMAL LOW (ref 12.0–16.0)
LYMPHS ABS: 0.4 x10 3/mm — AB (ref 1.0–3.6)
Lymphocyte %: 22.1 %
MCH: 30 pg (ref 26.0–34.0)
MCHC: 33.3 g/dL (ref 32.0–36.0)
MCV: 90 fL (ref 80–100)
Monocyte #: 0.1 x10 3/mm — ABNORMAL LOW (ref 0.2–0.9)
Monocyte %: 5.3 %
Neutrophil #: 1.4 x10 3/mm (ref 1.4–6.5)
Neutrophil %: 71.4 %
Platelet: 205 x10 3/mm (ref 150–440)
RBC: 3.41 10*6/uL — ABNORMAL LOW (ref 3.80–5.20)
RDW: 16.9 % — AB (ref 11.5–14.5)
WBC: 1.9 x10 3/mm — AB (ref 3.6–11.0)

## 2014-02-04 LAB — POTASSIUM: POTASSIUM: 3.9 mmol/L (ref 3.5–5.1)

## 2014-02-11 LAB — BASIC METABOLIC PANEL
ANION GAP: 10 (ref 7–16)
BUN: 18 mg/dL (ref 7–18)
CO2: 30 mmol/L (ref 21–32)
Calcium, Total: 9.5 mg/dL (ref 8.5–10.1)
Chloride: 102 mmol/L (ref 98–107)
Creatinine: 1 mg/dL (ref 0.60–1.30)
EGFR (African American): >60
EGFR (Non-African Amer.): 55 — ABNORMAL LOW
Glucose: 225 mg/dL — ABNORMAL HIGH (ref 65–99)
Osmolality: 292 (ref 275–301)
POTASSIUM: 4.1 mmol/L (ref 3.5–5.1)
Sodium: 142 mmol/L (ref 136–145)

## 2014-02-11 LAB — CBC CANCER CENTER
Basophil #: 0 x10 3/mm (ref 0.0–0.1)
Basophil %: 0.6 %
EOS PCT: 0.8 %
Eosinophil #: 0 x10 3/mm (ref 0.0–0.7)
HCT: 30.8 % — AB (ref 35.0–47.0)
HGB: 10.1 g/dL — ABNORMAL LOW (ref 12.0–16.0)
Lymphocyte #: 0.5 x10 3/mm — ABNORMAL LOW (ref 1.0–3.6)
Lymphocyte %: 37.8 %
MCH: 29.7 pg (ref 26.0–34.0)
MCHC: 32.7 g/dL (ref 32.0–36.0)
MCV: 91 fL (ref 80–100)
Monocyte #: 0.2 x10 3/mm (ref 0.2–0.9)
Monocyte %: 17.5 %
Neutrophil #: 0.6 x10 3/mm — ABNORMAL LOW (ref 1.4–6.5)
Neutrophil %: 43.3 %
PLATELETS: 164 x10 3/mm (ref 150–440)
RBC: 3.39 10*6/uL — ABNORMAL LOW (ref 3.80–5.20)
RDW: 17.7 % — ABNORMAL HIGH (ref 11.5–14.5)
WBC: 1.3 x10 3/mm — AB (ref 3.6–11.0)

## 2014-02-11 LAB — MAGNESIUM: MAGNESIUM: 1.6 mg/dL — AB

## 2014-02-12 LAB — CA 125: CA 125: 72.1 U/mL — ABNORMAL HIGH (ref 0.0–34.0)

## 2014-02-18 LAB — CBC CANCER CENTER
BASOS ABS: 0 x10 3/mm (ref 0.0–0.1)
Basophil %: 0.2 %
EOS PCT: 0.2 %
Eosinophil #: 0 x10 3/mm (ref 0.0–0.7)
HCT: 29.8 % — ABNORMAL LOW (ref 35.0–47.0)
HGB: 9.8 g/dL — AB (ref 12.0–16.0)
Lymphocyte #: 0.5 x10 3/mm — ABNORMAL LOW (ref 1.0–3.6)
Lymphocyte %: 14.6 %
MCH: 30.2 pg (ref 26.0–34.0)
MCHC: 33 g/dL (ref 32.0–36.0)
MCV: 91 fL (ref 80–100)
MONO ABS: 0.5 x10 3/mm (ref 0.2–0.9)
MONOS PCT: 15.5 %
NEUTROS ABS: 2.2 x10 3/mm (ref 1.4–6.5)
NEUTROS PCT: 69.5 %
Platelet: 121 x10 3/mm — ABNORMAL LOW (ref 150–440)
RBC: 3.26 10*6/uL — ABNORMAL LOW (ref 3.80–5.20)
RDW: 20.3 % — ABNORMAL HIGH (ref 11.5–14.5)
WBC: 3.1 x10 3/mm — AB (ref 3.6–11.0)

## 2014-02-18 LAB — COMPREHENSIVE METABOLIC PANEL
ANION GAP: 9 (ref 7–16)
AST: 16 U/L (ref 15–37)
Albumin: 3.3 g/dL — ABNORMAL LOW (ref 3.4–5.0)
Alkaline Phosphatase: 88 U/L
BUN: 16 mg/dL (ref 7–18)
Bilirubin,Total: 0.2 mg/dL (ref 0.2–1.0)
CO2: 28 mmol/L (ref 21–32)
Calcium, Total: 9 mg/dL (ref 8.5–10.1)
Chloride: 103 mmol/L (ref 98–107)
Creatinine: 0.94 mg/dL (ref 0.60–1.30)
EGFR (Non-African Amer.): 59 — ABNORMAL LOW
Glucose: 176 mg/dL — ABNORMAL HIGH (ref 65–99)
Osmolality: 285 (ref 275–301)
Potassium: 4 mmol/L (ref 3.5–5.1)
SGPT (ALT): 16 U/L (ref 12–78)
Sodium: 140 mmol/L (ref 136–145)
Total Protein: 6.5 g/dL (ref 6.4–8.2)

## 2014-02-18 LAB — MAGNESIUM: MAGNESIUM: 1.2 mg/dL — AB

## 2014-02-20 LAB — MAGNESIUM: Magnesium: 2.3 mg/dL

## 2014-02-25 LAB — CBC CANCER CENTER
BASOS ABS: 0 x10 3/mm (ref 0.0–0.1)
BASOS PCT: 0.3 %
EOS ABS: 0 x10 3/mm (ref 0.0–0.7)
Eosinophil %: 0.5 %
HCT: 27.1 % — AB (ref 35.0–47.0)
HGB: 9.3 g/dL — ABNORMAL LOW (ref 12.0–16.0)
LYMPHS ABS: 0.5 x10 3/mm — AB (ref 1.0–3.6)
Lymphocyte %: 18.9 %
MCH: 31.1 pg (ref 26.0–34.0)
MCHC: 34.4 g/dL (ref 32.0–36.0)
MCV: 90 fL (ref 80–100)
MONOS PCT: 4.6 %
Monocyte #: 0.1 x10 3/mm — ABNORMAL LOW (ref 0.2–0.9)
Neutrophil #: 1.8 x10 3/mm (ref 1.4–6.5)
Neutrophil %: 75.7 %
PLATELETS: 139 x10 3/mm — AB (ref 150–440)
RBC: 3.01 10*6/uL — ABNORMAL LOW (ref 3.80–5.20)
RDW: 20.3 % — ABNORMAL HIGH (ref 11.5–14.5)
WBC: 2.4 x10 3/mm — ABNORMAL LOW (ref 3.6–11.0)

## 2014-02-25 LAB — BASIC METABOLIC PANEL
ANION GAP: 8 (ref 7–16)
BUN: 21 mg/dL — ABNORMAL HIGH (ref 7–18)
Calcium, Total: 8.7 mg/dL (ref 8.5–10.1)
Chloride: 103 mmol/L (ref 98–107)
Co2: 28 mmol/L (ref 21–32)
Creatinine: 0.98 mg/dL (ref 0.60–1.30)
EGFR (Non-African Amer.): 56 — ABNORMAL LOW
Glucose: 191 mg/dL — ABNORMAL HIGH (ref 65–99)
Osmolality: 286 (ref 275–301)
POTASSIUM: 4.2 mmol/L (ref 3.5–5.1)
SODIUM: 139 mmol/L (ref 136–145)

## 2014-02-25 LAB — MAGNESIUM: Magnesium: 1.6 mg/dL — ABNORMAL LOW

## 2014-02-27 ENCOUNTER — Ambulatory Visit: Payer: Self-pay | Admitting: Internal Medicine

## 2014-03-04 LAB — CBC CANCER CENTER
Basophil #: 0 x10 3/mm (ref 0.0–0.1)
Basophil %: 0.7 %
Eosinophil #: 0 x10 3/mm (ref 0.0–0.7)
Eosinophil %: 0.6 %
HCT: 27.3 % — AB (ref 35.0–47.0)
HGB: 9.2 g/dL — ABNORMAL LOW (ref 12.0–16.0)
LYMPHS ABS: 0.5 x10 3/mm — AB (ref 1.0–3.6)
Lymphocyte %: 36.3 %
MCH: 31.1 pg (ref 26.0–34.0)
MCHC: 33.8 g/dL (ref 32.0–36.0)
MCV: 92 fL (ref 80–100)
MONOS PCT: 19.6 %
Monocyte #: 0.3 x10 3/mm (ref 0.2–0.9)
NEUTROS ABS: 0.6 x10 3/mm — AB (ref 1.4–6.5)
Neutrophil %: 42.8 %
Platelet: 166 x10 3/mm (ref 150–440)
RBC: 2.97 10*6/uL — AB (ref 3.80–5.20)
RDW: 20.7 % — AB (ref 11.5–14.5)
WBC: 1.4 x10 3/mm — CL (ref 3.6–11.0)

## 2014-03-04 LAB — MAGNESIUM: MAGNESIUM: 1.7 mg/dL — AB

## 2014-03-05 LAB — CA 125: CA 125: 53.5 U/mL — AB (ref 0.0–34.0)

## 2014-03-11 LAB — CBC CANCER CENTER
Basophil #: 0 x10 3/mm (ref 0.0–0.1)
Basophil %: 0.4 %
EOS ABS: 0 x10 3/mm (ref 0.0–0.7)
Eosinophil %: 0.4 %
HCT: 27.7 % — ABNORMAL LOW (ref 35.0–47.0)
HGB: 9.4 g/dL — AB (ref 12.0–16.0)
LYMPHS ABS: 0.4 x10 3/mm — AB (ref 1.0–3.6)
Lymphocyte %: 19.5 %
MCH: 31.6 pg (ref 26.0–34.0)
MCHC: 33.9 g/dL (ref 32.0–36.0)
MCV: 93 fL (ref 80–100)
MONO ABS: 0.4 x10 3/mm (ref 0.2–0.9)
MONOS PCT: 19.7 %
NEUTROS PCT: 60 %
Neutrophil #: 1.3 x10 3/mm — ABNORMAL LOW (ref 1.4–6.5)
Platelet: 130 x10 3/mm — ABNORMAL LOW (ref 150–440)
RBC: 2.97 10*6/uL — ABNORMAL LOW (ref 3.80–5.20)
RDW: 21.9 % — AB (ref 11.5–14.5)
WBC: 2.2 x10 3/mm — ABNORMAL LOW (ref 3.6–11.0)

## 2014-03-11 LAB — COMPREHENSIVE METABOLIC PANEL
ALBUMIN: 3.4 g/dL (ref 3.4–5.0)
AST: 17 U/L (ref 15–37)
Alkaline Phosphatase: 85 U/L
Anion Gap: 8 (ref 7–16)
BILIRUBIN TOTAL: 0.3 mg/dL (ref 0.2–1.0)
BUN: 17 mg/dL (ref 7–18)
Calcium, Total: 8.5 mg/dL (ref 8.5–10.1)
Chloride: 106 mmol/L (ref 98–107)
Co2: 28 mmol/L (ref 21–32)
Creatinine: 0.87 mg/dL (ref 0.60–1.30)
EGFR (Non-African Amer.): >60
GLUCOSE: 184 mg/dL — AB (ref 65–99)
Osmolality: 289 (ref 275–301)
POTASSIUM: 3.9 mmol/L (ref 3.5–5.1)
SGPT (ALT): 17 U/L (ref 12–78)
Sodium: 142 mmol/L (ref 136–145)
TOTAL PROTEIN: 6.5 g/dL (ref 6.4–8.2)

## 2014-03-11 LAB — MAGNESIUM: Magnesium: 1.9 mg/dL

## 2014-03-12 LAB — CA 125: CA 125: 54 U/mL — ABNORMAL HIGH (ref 0.0–34.0)

## 2014-03-16 LAB — CBC CANCER CENTER
Basophil #: 0 x10 3/mm (ref 0.0–0.1)
Basophil %: 0.8 %
Eosinophil #: 0 x10 3/mm (ref 0.0–0.7)
Eosinophil %: 0.4 %
HCT: 27.2 % — ABNORMAL LOW (ref 35.0–47.0)
HGB: 9.3 g/dL — ABNORMAL LOW (ref 12.0–16.0)
Lymphocyte #: 0.5 x10 3/mm — ABNORMAL LOW (ref 1.0–3.6)
Lymphocyte %: 16.6 %
MCH: 32 pg (ref 26.0–34.0)
MCHC: 34.3 g/dL (ref 32.0–36.0)
MCV: 93 fL (ref 80–100)
Monocyte #: 0.4 x10 3/mm (ref 0.2–0.9)
Monocyte %: 11.6 %
NEUTROS ABS: 2.2 x10 3/mm (ref 1.4–6.5)
Neutrophil %: 70.6 %
Platelet: 154 x10 3/mm (ref 150–440)
RBC: 2.92 10*6/uL — ABNORMAL LOW (ref 3.80–5.20)
RDW: 22.4 % — ABNORMAL HIGH (ref 11.5–14.5)
WBC: 3.2 x10 3/mm — ABNORMAL LOW (ref 3.6–11.0)

## 2014-03-16 LAB — CREATININE, SERUM
Creatinine: 0.83 mg/dL (ref 0.60–1.30)
EGFR (Non-African Amer.): >60

## 2014-03-24 LAB — CBC CANCER CENTER
BASOS ABS: 0 x10 3/mm (ref 0.0–0.1)
Basophil %: 0.2 %
EOS PCT: 0.8 %
Eosinophil #: 0 x10 3/mm (ref 0.0–0.7)
HCT: 26.7 % — ABNORMAL LOW (ref 35.0–47.0)
HGB: 9.2 g/dL — ABNORMAL LOW (ref 12.0–16.0)
LYMPHS ABS: 0.5 x10 3/mm — AB (ref 1.0–3.6)
Lymphocyte %: 19.5 %
MCH: 33 pg (ref 26.0–34.0)
MCHC: 34.3 g/dL (ref 32.0–36.0)
MCV: 96 fL (ref 80–100)
Monocyte #: 0.3 x10 3/mm (ref 0.2–0.9)
Monocyte %: 9.1 %
Neutrophil #: 2 x10 3/mm (ref 1.4–6.5)
Neutrophil %: 70.4 %
PLATELETS: 178 x10 3/mm (ref 150–440)
RBC: 2.77 10*6/uL — AB (ref 3.80–5.20)
RDW: 21.2 % — ABNORMAL HIGH (ref 11.5–14.5)
WBC: 2.8 x10 3/mm — AB (ref 3.6–11.0)

## 2014-03-24 LAB — POTASSIUM: Potassium: 4 mmol/L (ref 3.5–5.1)

## 2014-03-24 LAB — MAGNESIUM: Magnesium: 1.6 mg/dL — ABNORMAL LOW

## 2014-03-30 ENCOUNTER — Ambulatory Visit: Payer: Self-pay | Admitting: Internal Medicine

## 2014-03-30 LAB — CBC CANCER CENTER
BASOS PCT: 0.3 %
Basophil #: 0 x10 3/mm (ref 0.0–0.1)
Eosinophil #: 0 x10 3/mm (ref 0.0–0.7)
Eosinophil %: 0.6 %
HCT: 26.3 % — AB (ref 35.0–47.0)
HGB: 8.9 g/dL — ABNORMAL LOW (ref 12.0–16.0)
LYMPHS PCT: 25.4 %
Lymphocyte #: 0.5 x10 3/mm — ABNORMAL LOW (ref 1.0–3.6)
MCH: 32.8 pg (ref 26.0–34.0)
MCHC: 33.8 g/dL (ref 32.0–36.0)
MCV: 97 fL (ref 80–100)
Monocyte #: 0.3 x10 3/mm (ref 0.2–0.9)
Monocyte %: 17.1 %
Neutrophil #: 1 x10 3/mm — ABNORMAL LOW (ref 1.4–6.5)
Neutrophil %: 56.6 %
Platelet: 149 x10 3/mm — ABNORMAL LOW (ref 150–440)
RBC: 2.7 10*6/uL — ABNORMAL LOW (ref 3.80–5.20)
RDW: 21.2 % — AB (ref 11.5–14.5)
WBC: 1.8 x10 3/mm — CL (ref 3.6–11.0)

## 2014-03-30 LAB — BASIC METABOLIC PANEL
ANION GAP: 7 (ref 7–16)
BUN: 18 mg/dL (ref 7–18)
CALCIUM: 9.5 mg/dL (ref 8.5–10.1)
Chloride: 102 mmol/L (ref 98–107)
Co2: 33 mmol/L — ABNORMAL HIGH (ref 21–32)
Creatinine: 0.99 mg/dL (ref 0.60–1.30)
GFR CALC NON AF AMER: 55 — AB
Glucose: 169 mg/dL — ABNORMAL HIGH (ref 65–99)
OSMOLALITY: 289 (ref 275–301)
Potassium: 3.8 mmol/L (ref 3.5–5.1)
Sodium: 142 mmol/L (ref 136–145)

## 2014-03-30 LAB — MAGNESIUM: Magnesium: 1.5 mg/dL — ABNORMAL LOW

## 2014-03-31 LAB — CA 125: CA 125: 45.3 U/mL — ABNORMAL HIGH (ref 0.0–34.0)

## 2014-04-02 LAB — CBC CANCER CENTER
Basophil #: 0 x10 3/mm (ref 0.0–0.1)
Basophil %: 0.2 %
EOS PCT: 0.2 %
Eosinophil #: 0 x10 3/mm (ref 0.0–0.7)
HCT: 26.4 % — ABNORMAL LOW (ref 35.0–47.0)
HGB: 9 g/dL — ABNORMAL LOW (ref 12.0–16.0)
LYMPHS ABS: 0.5 x10 3/mm — AB (ref 1.0–3.6)
Lymphocyte %: 25.3 %
MCH: 33.1 pg (ref 26.0–34.0)
MCHC: 34.1 g/dL (ref 32.0–36.0)
MCV: 97 fL (ref 80–100)
MONOS PCT: 12.9 %
Monocyte #: 0.3 x10 3/mm (ref 0.2–0.9)
Neutrophil #: 1.3 x10 3/mm — ABNORMAL LOW (ref 1.4–6.5)
Neutrophil %: 61.4 %
Platelet: 155 x10 3/mm (ref 150–440)
RBC: 2.72 10*6/uL — ABNORMAL LOW (ref 3.80–5.20)
RDW: 21.3 % — ABNORMAL HIGH (ref 11.5–14.5)
WBC: 2.2 x10 3/mm — ABNORMAL LOW (ref 3.6–11.0)

## 2014-04-02 LAB — MAGNESIUM: MAGNESIUM: 1.8 mg/dL

## 2014-04-06 LAB — MAGNESIUM: MAGNESIUM: 1.6 mg/dL — AB

## 2014-04-06 LAB — COMPREHENSIVE METABOLIC PANEL
ALBUMIN: 3.2 g/dL — AB (ref 3.4–5.0)
ANION GAP: 7 (ref 7–16)
Alkaline Phosphatase: 86 U/L
BILIRUBIN TOTAL: 0.2 mg/dL (ref 0.2–1.0)
BUN: 19 mg/dL — ABNORMAL HIGH (ref 7–18)
CO2: 28 mmol/L (ref 21–32)
Calcium, Total: 8.9 mg/dL (ref 8.5–10.1)
Chloride: 108 mmol/L — ABNORMAL HIGH (ref 98–107)
Creatinine: 0.91 mg/dL (ref 0.60–1.30)
EGFR (Non-African Amer.): >60
Glucose: 142 mg/dL — ABNORMAL HIGH (ref 65–99)
Osmolality: 290 (ref 275–301)
POTASSIUM: 3.6 mmol/L (ref 3.5–5.1)
SGOT(AST): 17 U/L (ref 15–37)
SGPT (ALT): 18 U/L (ref 12–78)
SODIUM: 143 mmol/L (ref 136–145)
Total Protein: 6.4 g/dL (ref 6.4–8.2)

## 2014-04-06 LAB — CBC CANCER CENTER
Basophil #: 0 x10 3/mm (ref 0.0–0.1)
Basophil %: 0.4 %
EOS PCT: 0.4 %
Eosinophil #: 0 x10 3/mm (ref 0.0–0.7)
HCT: 26.6 % — ABNORMAL LOW (ref 35.0–47.0)
HGB: 9.2 g/dL — ABNORMAL LOW (ref 12.0–16.0)
Lymphocyte #: 0.6 x10 3/mm — ABNORMAL LOW (ref 1.0–3.6)
Lymphocyte %: 22.4 %
MCH: 33.4 pg (ref 26.0–34.0)
MCHC: 34.4 g/dL (ref 32.0–36.0)
MCV: 97 fL (ref 80–100)
MONO ABS: 0.4 x10 3/mm (ref 0.2–0.9)
Monocyte %: 16.7 %
Neutrophil #: 1.5 x10 3/mm (ref 1.4–6.5)
Neutrophil %: 60.1 %
PLATELETS: 147 x10 3/mm — AB (ref 150–440)
RBC: 2.74 10*6/uL — AB (ref 3.80–5.20)
RDW: 20.8 % — ABNORMAL HIGH (ref 11.5–14.5)
WBC: 2.5 x10 3/mm — ABNORMAL LOW (ref 3.6–11.0)

## 2014-04-08 LAB — CBC CANCER CENTER
BASOS PCT: 0.5 %
Basophil #: 0 x10 3/mm (ref 0.0–0.1)
EOS ABS: 0 x10 3/mm (ref 0.0–0.7)
EOS PCT: 0.6 %
HCT: 26.5 % — ABNORMAL LOW (ref 35.0–47.0)
HGB: 8.9 g/dL — ABNORMAL LOW (ref 12.0–16.0)
LYMPHS PCT: 18.5 %
Lymphocyte #: 0.5 x10 3/mm — ABNORMAL LOW (ref 1.0–3.6)
MCH: 33.1 pg (ref 26.0–34.0)
MCHC: 33.6 g/dL (ref 32.0–36.0)
MCV: 99 fL (ref 80–100)
MONO ABS: 0.4 x10 3/mm (ref 0.2–0.9)
MONOS PCT: 16.6 %
NEUTROS ABS: 1.6 x10 3/mm (ref 1.4–6.5)
Neutrophil %: 63.8 %
Platelet: 142 x10 3/mm — ABNORMAL LOW (ref 150–440)
RBC: 2.69 10*6/uL — ABNORMAL LOW (ref 3.80–5.20)
RDW: 21 % — AB (ref 11.5–14.5)
WBC: 2.6 x10 3/mm — ABNORMAL LOW (ref 3.6–11.0)

## 2014-04-08 LAB — MAGNESIUM: Magnesium: 1.5 mg/dL — ABNORMAL LOW

## 2014-04-08 LAB — CREATININE, SERUM
Creatinine: 0.93 mg/dL (ref 0.60–1.30)
EGFR (African American): >60
GFR CALC NON AF AMER: 60 — AB

## 2014-04-08 LAB — POTASSIUM: POTASSIUM: 4.2 mmol/L (ref 3.5–5.1)

## 2014-04-13 LAB — BASIC METABOLIC PANEL
Anion Gap: 9 (ref 7–16)
BUN: 15 mg/dL (ref 7–18)
CALCIUM: 9 mg/dL (ref 8.5–10.1)
CHLORIDE: 103 mmol/L (ref 98–107)
CREATININE: 1.04 mg/dL (ref 0.60–1.30)
Co2: 29 mmol/L (ref 21–32)
EGFR (Non-African Amer.): 52 — ABNORMAL LOW
GLUCOSE: 216 mg/dL — AB (ref 65–99)
Osmolality: 289 (ref 275–301)
POTASSIUM: 3.9 mmol/L (ref 3.5–5.1)
Sodium: 141 mmol/L (ref 136–145)

## 2014-04-13 LAB — CBC CANCER CENTER
BASOS ABS: 0 x10 3/mm (ref 0.0–0.1)
Basophil %: 0.5 %
EOS ABS: 0 x10 3/mm (ref 0.0–0.7)
EOS PCT: 0.9 %
HCT: 28 % — ABNORMAL LOW (ref 35.0–47.0)
HGB: 9.4 g/dL — AB (ref 12.0–16.0)
LYMPHS PCT: 21.5 %
Lymphocyte #: 0.5 x10 3/mm — ABNORMAL LOW (ref 1.0–3.6)
MCH: 33.3 pg (ref 26.0–34.0)
MCHC: 33.5 g/dL (ref 32.0–36.0)
MCV: 100 fL (ref 80–100)
MONO ABS: 0.3 x10 3/mm (ref 0.2–0.9)
Monocyte %: 13.9 %
NEUTROS ABS: 1.4 x10 3/mm (ref 1.4–6.5)
NEUTROS PCT: 63.2 %
PLATELETS: 157 x10 3/mm (ref 150–440)
RBC: 2.82 10*6/uL — AB (ref 3.80–5.20)
RDW: 19.7 % — ABNORMAL HIGH (ref 11.5–14.5)
WBC: 2.3 x10 3/mm — AB (ref 3.6–11.0)

## 2014-04-13 LAB — MAGNESIUM: Magnesium: 1.7 mg/dL — ABNORMAL LOW

## 2014-04-20 LAB — CBC CANCER CENTER
BASOS PCT: 0.3 %
Basophil #: 0 x10 3/mm (ref 0.0–0.1)
EOS ABS: 0 x10 3/mm (ref 0.0–0.7)
EOS PCT: 1.1 %
HCT: 30 % — ABNORMAL LOW (ref 35.0–47.0)
HGB: 10.1 g/dL — ABNORMAL LOW (ref 12.0–16.0)
LYMPHS ABS: 0.5 x10 3/mm — AB (ref 1.0–3.6)
LYMPHS PCT: 20.1 %
MCH: 33.7 pg (ref 26.0–34.0)
MCHC: 33.7 g/dL (ref 32.0–36.0)
MCV: 100 fL (ref 80–100)
MONO ABS: 0.3 x10 3/mm (ref 0.2–0.9)
MONOS PCT: 10.4 %
Neutrophil #: 1.9 x10 3/mm (ref 1.4–6.5)
Neutrophil %: 68.1 %
Platelet: 186 x10 3/mm (ref 150–440)
RBC: 3 10*6/uL — ABNORMAL LOW (ref 3.80–5.20)
RDW: 18.5 % — AB (ref 11.5–14.5)
WBC: 2.7 x10 3/mm — AB (ref 3.6–11.0)

## 2014-04-20 LAB — CREATININE, SERUM
Creatinine: 0.95 mg/dL (ref 0.60–1.30)
EGFR (African American): >60
EGFR (Non-African Amer.): 58 — ABNORMAL LOW

## 2014-04-20 LAB — POTASSIUM: Potassium: 4.1 mmol/L (ref 3.5–5.1)

## 2014-04-20 LAB — MAGNESIUM: Magnesium: 1.6 mg/dL — ABNORMAL LOW

## 2014-04-27 LAB — CBC CANCER CENTER
Basophil #: 0 x10 3/mm (ref 0.0–0.1)
Basophil %: 0.4 %
EOS PCT: 1.7 %
Eosinophil #: 0 x10 3/mm (ref 0.0–0.7)
HCT: 29.4 % — AB (ref 35.0–47.0)
HGB: 9.8 g/dL — ABNORMAL LOW (ref 12.0–16.0)
Lymphocyte #: 0.7 x10 3/mm — ABNORMAL LOW (ref 1.0–3.6)
Lymphocyte %: 27.5 %
MCH: 33.4 pg (ref 26.0–34.0)
MCHC: 33.4 g/dL (ref 32.0–36.0)
MCV: 100 fL (ref 80–100)
MONOS PCT: 6.2 %
Monocyte #: 0.2 x10 3/mm (ref 0.2–0.9)
Neutrophil #: 1.7 x10 3/mm (ref 1.4–6.5)
Neutrophil %: 64.2 %
PLATELETS: 154 x10 3/mm (ref 150–440)
RBC: 2.94 10*6/uL — AB (ref 3.80–5.20)
RDW: 16.9 % — AB (ref 11.5–14.5)
WBC: 2.6 x10 3/mm — ABNORMAL LOW (ref 3.6–11.0)

## 2014-04-27 LAB — MAGNESIUM: MAGNESIUM: 1.6 mg/dL — AB

## 2014-04-29 ENCOUNTER — Ambulatory Visit: Payer: Self-pay | Admitting: Internal Medicine

## 2014-05-04 LAB — CBC CANCER CENTER
Basophil #: 0 x10 3/mm (ref 0.0–0.1)
Basophil %: 0.5 %
Eosinophil #: 0 x10 3/mm (ref 0.0–0.7)
Eosinophil %: 0.9 %
HCT: 28.7 % — ABNORMAL LOW (ref 35.0–47.0)
HGB: 9.8 g/dL — AB (ref 12.0–16.0)
Lymphocyte #: 0.7 x10 3/mm — ABNORMAL LOW (ref 1.0–3.6)
Lymphocyte %: 34.2 %
MCH: 34.2 pg — ABNORMAL HIGH (ref 26.0–34.0)
MCHC: 34 g/dL (ref 32.0–36.0)
MCV: 101 fL — ABNORMAL HIGH (ref 80–100)
MONOS PCT: 15.2 %
Monocyte #: 0.3 x10 3/mm (ref 0.2–0.9)
Neutrophil #: 1 x10 3/mm — ABNORMAL LOW (ref 1.4–6.5)
Neutrophil %: 49.2 %
Platelet: 146 x10 3/mm — ABNORMAL LOW (ref 150–440)
RBC: 2.86 10*6/uL — AB (ref 3.80–5.20)
RDW: 16.6 % — ABNORMAL HIGH (ref 11.5–14.5)
WBC: 2 x10 3/mm — CL (ref 3.6–11.0)

## 2014-05-04 LAB — BASIC METABOLIC PANEL
ANION GAP: 8 (ref 7–16)
BUN: 16 mg/dL (ref 7–18)
CO2: 30 mmol/L (ref 21–32)
CREATININE: 1.12 mg/dL (ref 0.60–1.30)
Calcium, Total: 9.2 mg/dL (ref 8.5–10.1)
Chloride: 104 mmol/L (ref 98–107)
GFR CALC AF AMER: 55 — AB
GFR CALC NON AF AMER: 48 — AB
GLUCOSE: 176 mg/dL — AB (ref 65–99)
OSMOLALITY: 289 (ref 275–301)
POTASSIUM: 3.8 mmol/L (ref 3.5–5.1)
Sodium: 142 mmol/L (ref 136–145)

## 2014-05-04 LAB — HEPATIC FUNCTION PANEL A (ARMC)
ALBUMIN: 3.4 g/dL (ref 3.4–5.0)
AST: 23 U/L (ref 15–37)
Alkaline Phosphatase: 78 U/L
BILIRUBIN TOTAL: 0.3 mg/dL (ref 0.2–1.0)
Bilirubin, Direct: <0.1 mg/dL (ref 0.00–0.20)
SGPT (ALT): 21 U/L (ref 12–78)
Total Protein: 6.5 g/dL (ref 6.4–8.2)

## 2014-05-04 LAB — MAGNESIUM: Magnesium: 1.6 mg/dL — ABNORMAL LOW

## 2014-05-05 LAB — CA 125: CA 125: 52.5 U/mL — AB (ref 0.0–34.0)

## 2014-05-11 LAB — CBC CANCER CENTER
BASOS ABS: 0 x10 3/mm (ref 0.0–0.1)
Basophil %: 0.4 %
EOS ABS: 0 x10 3/mm (ref 0.0–0.7)
Eosinophil %: 0.5 %
HCT: 29.6 % — AB (ref 35.0–47.0)
HGB: 10 g/dL — ABNORMAL LOW (ref 12.0–16.0)
Lymphocyte #: 0.7 x10 3/mm — ABNORMAL LOW (ref 1.0–3.6)
Lymphocyte %: 26.4 %
MCH: 33.5 pg (ref 26.0–34.0)
MCHC: 33.8 g/dL (ref 32.0–36.0)
MCV: 99 fL (ref 80–100)
Monocyte #: 0.4 x10 3/mm (ref 0.2–0.9)
Monocyte %: 16.1 %
NEUTROS ABS: 1.4 x10 3/mm (ref 1.4–6.5)
Neutrophil %: 56.6 %
Platelet: 140 x10 3/mm — ABNORMAL LOW (ref 150–440)
RBC: 2.98 10*6/uL — AB (ref 3.80–5.20)
RDW: 16.5 % — ABNORMAL HIGH (ref 11.5–14.5)
WBC: 2.5 x10 3/mm — ABNORMAL LOW (ref 3.6–11.0)

## 2014-05-11 LAB — MAGNESIUM: Magnesium: 1.7 mg/dL — ABNORMAL LOW

## 2014-05-19 LAB — CA 125: CA 125: 61.3 U/mL — ABNORMAL HIGH (ref 0.0–34.0)

## 2014-05-25 LAB — COMPREHENSIVE METABOLIC PANEL
ALBUMIN: 3.5 g/dL (ref 3.4–5.0)
ALT: 19 U/L
ANION GAP: 5 — AB (ref 7–16)
Alkaline Phosphatase: 92 U/L
BUN: 16 mg/dL (ref 7–18)
Bilirubin,Total: 0.2 mg/dL (ref 0.2–1.0)
CO2: 32 mmol/L (ref 21–32)
CREATININE: 0.86 mg/dL (ref 0.60–1.30)
Calcium, Total: 9.5 mg/dL (ref 8.5–10.1)
Chloride: 103 mmol/L (ref 98–107)
EGFR (Non-African Amer.): >60
Glucose: 122 mg/dL — ABNORMAL HIGH (ref 65–99)
OSMOLALITY: 282 (ref 275–301)
Potassium: 4.7 mmol/L (ref 3.5–5.1)
SGOT(AST): 16 U/L (ref 15–37)
SODIUM: 140 mmol/L (ref 136–145)
TOTAL PROTEIN: 6.9 g/dL (ref 6.4–8.2)

## 2014-05-25 LAB — CBC CANCER CENTER
BASOS ABS: 0 x10 3/mm (ref 0.0–0.1)
Basophil %: 0.2 %
Eosinophil #: 0 x10 3/mm (ref 0.0–0.7)
Eosinophil %: 0.7 %
HCT: 32.9 % — ABNORMAL LOW (ref 35.0–47.0)
HGB: 10.8 g/dL — ABNORMAL LOW (ref 12.0–16.0)
Lymphocyte #: 0.6 x10 3/mm — ABNORMAL LOW (ref 1.0–3.6)
Lymphocyte %: 17.4 %
MCH: 32.7 pg (ref 26.0–34.0)
MCHC: 32.9 g/dL (ref 32.0–36.0)
MCV: 99 fL (ref 80–100)
Monocyte #: 0.4 x10 3/mm (ref 0.2–0.9)
Monocyte %: 11.3 %
Neutrophil #: 2.2 x10 3/mm (ref 1.4–6.5)
Neutrophil %: 70.4 %
PLATELETS: 189 x10 3/mm (ref 150–440)
RBC: 3.32 10*6/uL — ABNORMAL LOW (ref 3.80–5.20)
RDW: 15.7 % — AB (ref 11.5–14.5)
WBC: 3.2 x10 3/mm — AB (ref 3.6–11.0)

## 2014-05-25 LAB — MAGNESIUM: MAGNESIUM: 1.9 mg/dL

## 2014-05-30 ENCOUNTER — Ambulatory Visit: Payer: Self-pay | Admitting: Internal Medicine

## 2014-06-01 LAB — BASIC METABOLIC PANEL
ANION GAP: 7 (ref 7–16)
BUN: 19 mg/dL — ABNORMAL HIGH (ref 7–18)
Calcium, Total: 9 mg/dL (ref 8.5–10.1)
Chloride: 102 mmol/L (ref 98–107)
Co2: 30 mmol/L (ref 21–32)
Creatinine: 1.14 mg/dL (ref 0.60–1.30)
EGFR (Non-African Amer.): 47 — ABNORMAL LOW
GFR CALC AF AMER: 54 — AB
GLUCOSE: 198 mg/dL — AB (ref 65–99)
Osmolality: 285 (ref 275–301)
Potassium: 3.9 mmol/L (ref 3.5–5.1)
SODIUM: 139 mmol/L (ref 136–145)

## 2014-06-01 LAB — CBC CANCER CENTER
Basophil #: 0 x10 3/mm (ref 0.0–0.1)
Basophil %: 0.4 %
EOS ABS: 0 x10 3/mm (ref 0.0–0.7)
EOS PCT: 1.5 %
HCT: 30.5 % — ABNORMAL LOW (ref 35.0–47.0)
HGB: 10.2 g/dL — ABNORMAL LOW (ref 12.0–16.0)
LYMPHS ABS: 0.4 x10 3/mm — AB (ref 1.0–3.6)
LYMPHS PCT: 21.8 %
MCH: 33 pg (ref 26.0–34.0)
MCHC: 33.3 g/dL (ref 32.0–36.0)
MCV: 99 fL (ref 80–100)
Monocyte #: 0.1 x10 3/mm — ABNORMAL LOW (ref 0.2–0.9)
Monocyte %: 5.1 %
Neutrophil #: 1.3 x10 3/mm — ABNORMAL LOW (ref 1.4–6.5)
Neutrophil %: 71.2 %
Platelet: 165 x10 3/mm (ref 150–440)
RBC: 3.08 10*6/uL — ABNORMAL LOW (ref 3.80–5.20)
RDW: 15.7 % — AB (ref 11.5–14.5)
WBC: 1.8 x10 3/mm — AB (ref 3.6–11.0)

## 2014-06-01 LAB — MAGNESIUM: Magnesium: 1.6 mg/dL — ABNORMAL LOW

## 2014-06-08 LAB — CBC CANCER CENTER
Basophil #: 0 x10 3/mm (ref 0.0–0.1)
Basophil %: 0.4 %
Eosinophil #: 0 x10 3/mm (ref 0.0–0.7)
Eosinophil %: 1.4 %
HCT: 31.9 % — AB (ref 35.0–47.0)
HGB: 10.7 g/dL — ABNORMAL LOW (ref 12.0–16.0)
LYMPHS ABS: 0.6 x10 3/mm — AB (ref 1.0–3.6)
Lymphocyte %: 31.4 %
MCH: 33 pg (ref 26.0–34.0)
MCHC: 33.4 g/dL (ref 32.0–36.0)
MCV: 99 fL (ref 80–100)
MONO ABS: 0.4 x10 3/mm (ref 0.2–0.9)
Monocyte %: 22.1 %
Neutrophil #: 0.8 x10 3/mm — ABNORMAL LOW (ref 1.4–6.5)
Neutrophil %: 44.7 %
PLATELETS: 162 x10 3/mm (ref 150–440)
RBC: 3.23 10*6/uL — ABNORMAL LOW (ref 3.80–5.20)
RDW: 15.4 % — ABNORMAL HIGH (ref 11.5–14.5)
WBC: 1.8 x10 3/mm — AB (ref 3.6–11.0)

## 2014-06-08 LAB — MAGNESIUM: Magnesium: 1.6 mg/dL — ABNORMAL LOW

## 2014-06-08 LAB — POTASSIUM: Potassium: 4.1 mmol/L (ref 3.5–5.1)

## 2014-06-10 LAB — CA 125: CA 125: 68.3 U/mL — ABNORMAL HIGH (ref 0.0–34.0)

## 2014-06-15 LAB — COMPREHENSIVE METABOLIC PANEL
ALT: 19 U/L
Albumin: 3.3 g/dL — ABNORMAL LOW (ref 3.4–5.0)
Alkaline Phosphatase: 91 U/L
Anion Gap: 10 (ref 7–16)
BUN: 16 mg/dL (ref 7–18)
Bilirubin,Total: 0.2 mg/dL (ref 0.2–1.0)
Calcium, Total: 8.7 mg/dL (ref 8.5–10.1)
Chloride: 102 mmol/L (ref 98–107)
Co2: 27 mmol/L (ref 21–32)
Creatinine: 0.97 mg/dL (ref 0.60–1.30)
EGFR (African American): >60
EGFR (Non-African Amer.): 57 — ABNORMAL LOW
GLUCOSE: 196 mg/dL — AB (ref 65–99)
Osmolality: 284 (ref 275–301)
POTASSIUM: 4 mmol/L (ref 3.5–5.1)
SGOT(AST): 17 U/L (ref 15–37)
SODIUM: 139 mmol/L (ref 136–145)
TOTAL PROTEIN: 6.6 g/dL (ref 6.4–8.2)

## 2014-06-15 LAB — CBC CANCER CENTER
Basophil #: 0 x10 3/mm (ref 0.0–0.1)
Basophil %: 0.3 %
Eosinophil #: 0 x10 3/mm (ref 0.0–0.7)
Eosinophil %: 0.9 %
HCT: 32.6 % — ABNORMAL LOW (ref 35.0–47.0)
HGB: 10.6 g/dL — AB (ref 12.0–16.0)
LYMPHS ABS: 0.4 x10 3/mm — AB (ref 1.0–3.6)
Lymphocyte %: 19.6 %
MCH: 32.2 pg (ref 26.0–34.0)
MCHC: 32.6 g/dL (ref 32.0–36.0)
MCV: 99 fL (ref 80–100)
MONOS PCT: 13.6 %
Monocyte #: 0.3 x10 3/mm (ref 0.2–0.9)
NEUTROS ABS: 1.4 x10 3/mm (ref 1.4–6.5)
Neutrophil %: 65.6 %
PLATELETS: 146 x10 3/mm — AB (ref 150–440)
RBC: 3.31 10*6/uL — ABNORMAL LOW (ref 3.80–5.20)
RDW: 15.9 % — AB (ref 11.5–14.5)
WBC: 2.2 x10 3/mm — ABNORMAL LOW (ref 3.6–11.0)

## 2014-06-15 LAB — MAGNESIUM: MAGNESIUM: 1.5 mg/dL — AB

## 2014-06-24 LAB — MAGNESIUM: Magnesium: 1.8 mg/dL

## 2014-06-24 LAB — CBC CANCER CENTER
BASOS PCT: 0.3 %
Basophil #: 0 x10 3/mm (ref 0.0–0.1)
EOS ABS: 0 x10 3/mm (ref 0.0–0.7)
EOS PCT: 1 %
HCT: 32.7 % — ABNORMAL LOW (ref 35.0–47.0)
HGB: 10.8 g/dL — ABNORMAL LOW (ref 12.0–16.0)
LYMPHS ABS: 0.5 x10 3/mm — AB (ref 1.0–3.6)
Lymphocyte %: 16.6 %
MCH: 32.3 pg (ref 26.0–34.0)
MCHC: 33 g/dL (ref 32.0–36.0)
MCV: 98 fL (ref 80–100)
MONOS PCT: 13.7 %
Monocyte #: 0.4 x10 3/mm (ref 0.2–0.9)
NEUTROS ABS: 2 x10 3/mm (ref 1.4–6.5)
Neutrophil %: 68.4 %
PLATELETS: 173 x10 3/mm (ref 150–440)
RBC: 3.34 10*6/uL — AB (ref 3.80–5.20)
RDW: 15.6 % — ABNORMAL HIGH (ref 11.5–14.5)
WBC: 3 x10 3/mm — ABNORMAL LOW (ref 3.6–11.0)

## 2014-06-24 LAB — CREATININE, SERUM
Creatinine: 1.02 mg/dL (ref 0.60–1.30)
EGFR (African American): >60
EGFR (Non-African Amer.): 53 — ABNORMAL LOW

## 2014-06-24 LAB — POTASSIUM: Potassium: 4.1 mmol/L (ref 3.5–5.1)

## 2014-06-25 LAB — CA 125: CA 125: 71.1 U/mL — ABNORMAL HIGH (ref 0.0–34.0)

## 2014-06-30 ENCOUNTER — Ambulatory Visit: Payer: Self-pay | Admitting: Internal Medicine

## 2014-07-01 LAB — COMPREHENSIVE METABOLIC PANEL
ALK PHOS: 83 U/L
Albumin: 3.3 g/dL — ABNORMAL LOW (ref 3.4–5.0)
Anion Gap: 10 (ref 7–16)
BUN: 19 mg/dL — ABNORMAL HIGH (ref 7–18)
Bilirubin,Total: 0.3 mg/dL (ref 0.2–1.0)
CALCIUM: 9 mg/dL (ref 8.5–10.1)
CREATININE: 1.11 mg/dL (ref 0.60–1.30)
Chloride: 102 mmol/L (ref 98–107)
Co2: 27 mmol/L (ref 21–32)
EGFR (African American): 56 — ABNORMAL LOW
EGFR (Non-African Amer.): 48 — ABNORMAL LOW
GLUCOSE: 198 mg/dL — AB (ref 65–99)
Osmolality: 285 (ref 275–301)
Potassium: 4.3 mmol/L (ref 3.5–5.1)
SGOT(AST): 19 U/L (ref 15–37)
SGPT (ALT): 18 U/L
SODIUM: 139 mmol/L (ref 136–145)
Total Protein: 6.6 g/dL (ref 6.4–8.2)

## 2014-07-01 LAB — MAGNESIUM: MAGNESIUM: 1.6 mg/dL — AB

## 2014-07-01 LAB — CBC CANCER CENTER
BASOS ABS: 0 x10 3/mm (ref 0.0–0.1)
Basophil %: 0.4 %
EOS ABS: 0 x10 3/mm (ref 0.0–0.7)
Eosinophil %: 1.1 %
HCT: 33 % — AB (ref 35.0–47.0)
HGB: 10.9 g/dL — ABNORMAL LOW (ref 12.0–16.0)
LYMPHS PCT: 14.6 %
Lymphocyte #: 0.4 x10 3/mm — ABNORMAL LOW (ref 1.0–3.6)
MCH: 32.1 pg (ref 26.0–34.0)
MCHC: 33.1 g/dL (ref 32.0–36.0)
MCV: 97 fL (ref 80–100)
MONO ABS: 0.3 x10 3/mm (ref 0.2–0.9)
MONOS PCT: 10.8 %
NEUTROS PCT: 73.1 %
Neutrophil #: 2.2 x10 3/mm (ref 1.4–6.5)
Platelet: 197 x10 3/mm (ref 150–440)
RBC: 3.4 10*6/uL — AB (ref 3.80–5.20)
RDW: 15.4 % — AB (ref 11.5–14.5)
WBC: 3 x10 3/mm — AB (ref 3.6–11.0)

## 2014-07-10 LAB — CBC CANCER CENTER
BASOS ABS: 0 x10 3/mm (ref 0.0–0.1)
Basophil %: 0.1 %
Eosinophil #: 0 x10 3/mm (ref 0.0–0.7)
Eosinophil %: 1.4 %
HCT: 30.7 % — ABNORMAL LOW (ref 35.0–47.0)
HGB: 10.2 g/dL — AB (ref 12.0–16.0)
Lymphocyte #: 0.5 x10 3/mm — ABNORMAL LOW (ref 1.0–3.6)
Lymphocyte %: 22.9 %
MCH: 32 pg (ref 26.0–34.0)
MCHC: 33.1 g/dL (ref 32.0–36.0)
MCV: 97 fL (ref 80–100)
Monocyte #: 0.2 x10 3/mm (ref 0.2–0.9)
Monocyte %: 8.4 %
Neutrophil #: 1.4 x10 3/mm (ref 1.4–6.5)
Neutrophil %: 67.2 %
Platelet: 157 x10 3/mm (ref 150–440)
RBC: 3.17 10*6/uL — AB (ref 3.80–5.20)
RDW: 14.8 % — AB (ref 11.5–14.5)
WBC: 2.1 x10 3/mm — ABNORMAL LOW (ref 3.6–11.0)

## 2014-07-10 LAB — POTASSIUM: Potassium: 3.9 mmol/L (ref 3.5–5.1)

## 2014-07-10 LAB — MAGNESIUM: MAGNESIUM: 1.5 mg/dL — AB

## 2014-07-15 LAB — CBC CANCER CENTER
Basophil #: 0 x10 3/mm (ref 0.0–0.1)
Basophil %: 0.4 %
EOS ABS: 0 x10 3/mm (ref 0.0–0.7)
Eosinophil %: 1.1 %
HCT: 31.3 % — ABNORMAL LOW (ref 35.0–47.0)
HGB: 10.2 g/dL — ABNORMAL LOW (ref 12.0–16.0)
Lymphocyte #: 0.6 x10 3/mm — ABNORMAL LOW (ref 1.0–3.6)
Lymphocyte %: 31.7 %
MCH: 31.5 pg (ref 26.0–34.0)
MCHC: 32.6 g/dL (ref 32.0–36.0)
MCV: 97 fL (ref 80–100)
MONO ABS: 0.4 x10 3/mm (ref 0.2–0.9)
Monocyte %: 20 %
NEUTROS PCT: 46.8 %
Neutrophil #: 0.9 x10 3/mm — ABNORMAL LOW (ref 1.4–6.5)
PLATELETS: 165 x10 3/mm (ref 150–440)
RBC: 3.24 10*6/uL — ABNORMAL LOW (ref 3.80–5.20)
RDW: 15.8 % — ABNORMAL HIGH (ref 11.5–14.5)
WBC: 1.9 x10 3/mm — CL (ref 3.6–11.0)

## 2014-07-15 LAB — MAGNESIUM: Magnesium: 1.5 mg/dL — ABNORMAL LOW

## 2014-07-16 LAB — CA 125: CA 125: 76.5 U/mL — AB (ref 0.0–34.0)

## 2014-07-22 LAB — CBC CANCER CENTER
Basophil #: 0 x10 3/mm (ref 0.0–0.1)
Basophil %: 0.4 %
Eosinophil #: 0 x10 3/mm (ref 0.0–0.7)
Eosinophil %: 0.8 %
HCT: 30.7 % — AB (ref 35.0–47.0)
HGB: 10.3 g/dL — ABNORMAL LOW (ref 12.0–16.0)
Lymphocyte #: 0.4 x10 3/mm — ABNORMAL LOW (ref 1.0–3.6)
Lymphocyte %: 15.6 %
MCH: 32 pg (ref 26.0–34.0)
MCHC: 33.5 g/dL (ref 32.0–36.0)
MCV: 96 fL (ref 80–100)
MONO ABS: 0.4 x10 3/mm (ref 0.2–0.9)
Monocyte %: 15.1 %
Neutrophil #: 1.9 x10 3/mm (ref 1.4–6.5)
Neutrophil %: 68.1 %
Platelet: 157 x10 3/mm (ref 150–440)
RBC: 3.21 10*6/uL — AB (ref 3.80–5.20)
RDW: 15.8 % — AB (ref 11.5–14.5)
WBC: 2.9 x10 3/mm — AB (ref 3.6–11.0)

## 2014-07-22 LAB — COMPREHENSIVE METABOLIC PANEL
ALBUMIN: 3.2 g/dL — AB (ref 3.4–5.0)
ALT: 24 U/L
Alkaline Phosphatase: 84 U/L
Anion Gap: 7 (ref 7–16)
BILIRUBIN TOTAL: 0.2 mg/dL (ref 0.2–1.0)
BUN: 19 mg/dL — ABNORMAL HIGH (ref 7–18)
CALCIUM: 8.9 mg/dL (ref 8.5–10.1)
CO2: 28 mmol/L (ref 21–32)
Chloride: 100 mmol/L (ref 98–107)
Creatinine: 0.94 mg/dL (ref 0.60–1.30)
EGFR (African American): >60
EGFR (Non-African Amer.): 59 — ABNORMAL LOW
Glucose: 177 mg/dL — ABNORMAL HIGH (ref 65–99)
Osmolality: 277 (ref 275–301)
POTASSIUM: 4 mmol/L (ref 3.5–5.1)
SGOT(AST): 21 U/L (ref 15–37)
SODIUM: 135 mmol/L — AB (ref 136–145)
TOTAL PROTEIN: 6.2 g/dL — AB (ref 6.4–8.2)

## 2014-07-22 LAB — MAGNESIUM: MAGNESIUM: 1.6 mg/dL — AB

## 2014-07-30 ENCOUNTER — Ambulatory Visit: Payer: Self-pay | Admitting: Internal Medicine

## 2014-08-03 LAB — CBC CANCER CENTER
Basophil #: 0 x10 3/mm (ref 0.0–0.1)
Basophil %: 0.4 %
EOS ABS: 0 x10 3/mm (ref 0.0–0.7)
Eosinophil %: 1.3 %
HCT: 29.6 % — ABNORMAL LOW (ref 35.0–47.0)
HGB: 9.9 g/dL — AB (ref 12.0–16.0)
LYMPHS ABS: 0.4 x10 3/mm — AB (ref 1.0–3.6)
Lymphocyte %: 22.5 %
MCH: 31.7 pg (ref 26.0–34.0)
MCHC: 33.4 g/dL (ref 32.0–36.0)
MCV: 95 fL (ref 80–100)
MONO ABS: 0.2 x10 3/mm (ref 0.2–0.9)
MONOS PCT: 14.4 %
NEUTROS ABS: 1 x10 3/mm — AB (ref 1.4–6.5)
Neutrophil %: 61.4 %
PLATELETS: 137 x10 3/mm — AB (ref 150–440)
RBC: 3.12 10*6/uL — AB (ref 3.80–5.20)
RDW: 15.8 % — ABNORMAL HIGH (ref 11.5–14.5)
WBC: 1.7 x10 3/mm — CL (ref 3.6–11.0)

## 2014-08-03 LAB — MAGNESIUM: Magnesium: 1.6 mg/dL — ABNORMAL LOW

## 2014-08-04 LAB — CA 125: CA 125: 92.5 U/mL — AB (ref 0.0–34.0)

## 2014-08-06 LAB — CBC CANCER CENTER
BASOS PCT: 0.5 %
Basophil #: 0 x10 3/mm (ref 0.0–0.1)
EOS ABS: 0 x10 3/mm (ref 0.0–0.7)
Eosinophil %: 1.4 %
HCT: 31.2 % — AB (ref 35.0–47.0)
HGB: 10.2 g/dL — ABNORMAL LOW (ref 12.0–16.0)
Lymphocyte #: 0.5 x10 3/mm — ABNORMAL LOW (ref 1.0–3.6)
Lymphocyte %: 27.5 %
MCH: 31.3 pg (ref 26.0–34.0)
MCHC: 32.8 g/dL (ref 32.0–36.0)
MCV: 95 fL (ref 80–100)
Monocyte #: 0.4 x10 3/mm (ref 0.2–0.9)
Monocyte %: 23.4 %
Neutrophil #: 0.8 x10 3/mm — ABNORMAL LOW (ref 1.4–6.5)
Neutrophil %: 47.2 %
Platelet: 156 x10 3/mm (ref 150–440)
RBC: 3.28 10*6/uL — AB (ref 3.80–5.20)
RDW: 16.3 % — AB (ref 11.5–14.5)
WBC: 1.7 x10 3/mm — AB (ref 3.6–11.0)

## 2014-08-06 LAB — MAGNESIUM: Magnesium: 1.6 mg/dL — ABNORMAL LOW

## 2014-08-12 LAB — WBC: WBC: 3.3 10*3/uL — AB (ref 3.6–11.0)

## 2014-08-12 LAB — MAGNESIUM: MAGNESIUM: 1.8 mg/dL

## 2014-08-30 ENCOUNTER — Ambulatory Visit: Payer: Self-pay | Admitting: Internal Medicine

## 2014-08-31 LAB — CBC CANCER CENTER
BASOS PCT: 0.4 %
Basophil #: 0 x10 3/mm (ref 0.0–0.1)
Eosinophil #: 0 x10 3/mm (ref 0.0–0.7)
Eosinophil %: 1.1 %
HCT: 30.6 % — ABNORMAL LOW (ref 35.0–47.0)
HGB: 10.1 g/dL — ABNORMAL LOW (ref 12.0–16.0)
LYMPHS ABS: 0.6 x10 3/mm — AB (ref 1.0–3.6)
LYMPHS PCT: 16.6 %
MCH: 31.6 pg (ref 26.0–34.0)
MCHC: 32.9 g/dL (ref 32.0–36.0)
MCV: 96 fL (ref 80–100)
MONOS PCT: 11.8 %
Monocyte #: 0.4 x10 3/mm (ref 0.2–0.9)
Neutrophil #: 2.4 x10 3/mm (ref 1.4–6.5)
Neutrophil %: 70.1 %
Platelet: 204 x10 3/mm (ref 150–440)
RBC: 3.19 10*6/uL — AB (ref 3.80–5.20)
RDW: 17.2 % — AB (ref 11.5–14.5)
WBC: 3.4 x10 3/mm — ABNORMAL LOW (ref 3.6–11.0)

## 2014-08-31 LAB — MAGNESIUM: Magnesium: 1.8 mg/dL

## 2014-09-01 LAB — CA 125: CA 125: 83.9 U/mL — AB (ref 0.0–34.0)

## 2014-09-04 LAB — URINALYSIS, COMPLETE
BACTERIA: NONE SEEN
BILIRUBIN, UR: NEGATIVE
Blood: NEGATIVE
Glucose,UR: 150 mg/dL (ref 0–75)
Ketone: NEGATIVE
Leukocyte Esterase: NEGATIVE
Nitrite: NEGATIVE
PROTEIN: NEGATIVE
Ph: 6 (ref 4.5–8.0)
RBC,UR: <1 /HPF (ref 0–5)
Specific Gravity: 1.003 (ref 1.003–1.030)
Squamous Epithelial: <1

## 2014-09-04 LAB — CBC CANCER CENTER
Basophil #: 0 x10 3/mm (ref 0.0–0.1)
Basophil %: 0.6 %
EOS ABS: 0 x10 3/mm (ref 0.0–0.7)
EOS PCT: 1.1 %
HCT: 32.4 % — ABNORMAL LOW (ref 35.0–47.0)
HGB: 10.6 g/dL — ABNORMAL LOW (ref 12.0–16.0)
LYMPHS ABS: 0.6 x10 3/mm — AB (ref 1.0–3.6)
LYMPHS PCT: 16.8 %
MCH: 31.2 pg (ref 26.0–34.0)
MCHC: 32.7 g/dL (ref 32.0–36.0)
MCV: 96 fL (ref 80–100)
MONO ABS: 0.4 x10 3/mm (ref 0.2–0.9)
Monocyte %: 11.6 %
NEUTROS PCT: 69.9 %
Neutrophil #: 2.4 x10 3/mm (ref 1.4–6.5)
Platelet: 216 x10 3/mm (ref 150–440)
RBC: 3.39 10*6/uL — ABNORMAL LOW (ref 3.80–5.20)
RDW: 16.9 % — AB (ref 11.5–14.5)
WBC: 3.4 x10 3/mm — ABNORMAL LOW (ref 3.6–11.0)

## 2014-09-04 LAB — BASIC METABOLIC PANEL
ANION GAP: 6 — AB (ref 7–16)
BUN: 14 mg/dL (ref 7–18)
CREATININE: 1.03 mg/dL (ref 0.60–1.30)
Calcium, Total: 9.3 mg/dL (ref 8.5–10.1)
Chloride: 99 mmol/L (ref 98–107)
Co2: 27 mmol/L (ref 21–32)
EGFR (Non-African Amer.): 55 — ABNORMAL LOW
GLUCOSE: 255 mg/dL — AB (ref 65–99)
Osmolality: 281 (ref 275–301)
Potassium: 3.7 mmol/L (ref 3.5–5.1)
Sodium: 136 mmol/L (ref 136–145)

## 2014-09-04 LAB — RETICULOCYTES
Absolute Retic Count: 0.0624 10*6/uL (ref 0.019–0.186)
Reticulocyte: 1.88 % (ref 0.4–3.1)

## 2014-09-04 LAB — HEPATIC FUNCTION PANEL A (ARMC)
ALBUMIN: 3.4 g/dL (ref 3.4–5.0)
ALT: 381 U/L — AB
Alkaline Phosphatase: 432 U/L — ABNORMAL HIGH
BILIRUBIN TOTAL: 2.5 mg/dL — AB (ref 0.2–1.0)
SGOT(AST): 185 U/L — ABNORMAL HIGH (ref 15–37)
Total Protein: 7.2 g/dL (ref 6.4–8.2)

## 2014-09-04 LAB — BILIRUBIN, DIRECT: BILIRUBIN DIRECT: 1.9 mg/dL — AB (ref 0.0–0.2)

## 2014-09-04 LAB — BILIRUBIN, TOTAL: Bilirubin,Total: 2.6 mg/dL — ABNORMAL HIGH (ref 0.2–1.0)

## 2014-09-04 LAB — LACTATE DEHYDROGENASE: LDH: 245 U/L (ref 81–246)

## 2014-09-05 LAB — URINE CULTURE

## 2014-09-07 LAB — CBC CANCER CENTER
BASOS ABS: 0 x10 3/mm (ref 0.0–0.1)
Basophil %: 0.6 %
EOS PCT: 0.3 %
Eosinophil #: 0 x10 3/mm (ref 0.0–0.7)
HCT: 33.5 % — AB (ref 35.0–47.0)
HGB: 10.9 g/dL — ABNORMAL LOW (ref 12.0–16.0)
Lymphocyte #: 0.6 x10 3/mm — ABNORMAL LOW (ref 1.0–3.6)
Lymphocyte %: 14.4 %
MCH: 31 pg (ref 26.0–34.0)
MCHC: 32.5 g/dL (ref 32.0–36.0)
MCV: 95 fL (ref 80–100)
MONO ABS: 0.5 x10 3/mm (ref 0.2–0.9)
Monocyte %: 12.4 %
NEUTROS ABS: 2.9 x10 3/mm (ref 1.4–6.5)
NEUTROS PCT: 72.3 %
PLATELETS: 243 x10 3/mm (ref 150–440)
RBC: 3.51 10*6/uL — AB (ref 3.80–5.20)
RDW: 17.2 % — AB (ref 11.5–14.5)
WBC: 4 x10 3/mm (ref 3.6–11.0)

## 2014-09-07 LAB — HEPATIC FUNCTION PANEL A (ARMC)
ALBUMIN: 3.3 g/dL — AB (ref 3.4–5.0)
ALK PHOS: 473 U/L — AB
ALT: 370 U/L — AB
Bilirubin, Direct: 4.2 mg/dL — ABNORMAL HIGH (ref 0.0–0.2)
Bilirubin,Total: 4.8 mg/dL — ABNORMAL HIGH (ref 0.2–1.0)
SGOT(AST): 200 U/L — ABNORMAL HIGH (ref 15–37)
TOTAL PROTEIN: 7 g/dL (ref 6.4–8.2)

## 2014-09-09 LAB — PATHOLOGY REPORT

## 2014-09-10 ENCOUNTER — Ambulatory Visit: Payer: Self-pay | Admitting: Gastroenterology

## 2014-09-14 LAB — COMPREHENSIVE METABOLIC PANEL
AST: 367 U/L — AB (ref 15–37)
Albumin: 3 g/dL — ABNORMAL LOW (ref 3.4–5.0)
Alkaline Phosphatase: 356 U/L — ABNORMAL HIGH
Anion Gap: 11 (ref 7–16)
BUN: 14 mg/dL (ref 7–18)
Bilirubin,Total: 4 mg/dL — ABNORMAL HIGH (ref 0.2–1.0)
CALCIUM: 9.6 mg/dL (ref 8.5–10.1)
Chloride: 95 mmol/L — ABNORMAL LOW (ref 98–107)
Co2: 29 mmol/L (ref 21–32)
Creatinine: 1.03 mg/dL (ref 0.60–1.30)
EGFR (African American): >60
EGFR (Non-African Amer.): 55 — ABNORMAL LOW
GLUCOSE: 469 mg/dL — AB (ref 65–99)
OSMOLALITY: 291 (ref 275–301)
POTASSIUM: 3.2 mmol/L — AB (ref 3.5–5.1)
SGPT (ALT): 550 U/L — ABNORMAL HIGH
Sodium: 135 mmol/L — ABNORMAL LOW (ref 136–145)
Total Protein: 6.9 g/dL (ref 6.4–8.2)

## 2014-09-15 LAB — COMPREHENSIVE METABOLIC PANEL
Albumin: 3 g/dL — ABNORMAL LOW (ref 3.4–5.0)
Alkaline Phosphatase: 339 U/L — ABNORMAL HIGH
Anion Gap: 5 — ABNORMAL LOW (ref 7–16)
BUN: 18 mg/dL (ref 7–18)
Bilirubin,Total: 2.8 mg/dL — ABNORMAL HIGH (ref 0.2–1.0)
CHLORIDE: 95 mmol/L — AB (ref 98–107)
Calcium, Total: 9.5 mg/dL (ref 8.5–10.1)
Co2: 32 mmol/L (ref 21–32)
Creatinine: 1.04 mg/dL (ref 0.60–1.30)
EGFR (African American): >60
EGFR (Non-African Amer.): 55 — ABNORMAL LOW
Glucose: 374 mg/dL — ABNORMAL HIGH (ref 65–99)
Osmolality: 282 (ref 275–301)
Potassium: 3.2 mmol/L — ABNORMAL LOW (ref 3.5–5.1)
SGOT(AST): 291 U/L — ABNORMAL HIGH (ref 15–37)
SGPT (ALT): 512 U/L — ABNORMAL HIGH
Sodium: 132 mmol/L — ABNORMAL LOW (ref 136–145)
Total Protein: 7 g/dL (ref 6.4–8.2)

## 2014-09-15 LAB — CBC CANCER CENTER
Basophil #: 0 x10 3/mm (ref 0.0–0.1)
Basophil %: 0.2 %
EOS ABS: 0 x10 3/mm (ref 0.0–0.7)
Eosinophil %: 0.4 %
HCT: 33.2 % — AB (ref 35.0–47.0)
HGB: 10.7 g/dL — AB (ref 12.0–16.0)
LYMPHS ABS: 0.5 x10 3/mm — AB (ref 1.0–3.6)
Lymphocyte %: 9.7 %
MCH: 31.1 pg (ref 26.0–34.0)
MCHC: 32.1 g/dL (ref 32.0–36.0)
MCV: 97 fL (ref 80–100)
MONO ABS: 0.6 x10 3/mm (ref 0.2–0.9)
Monocyte %: 11.3 %
NEUTROS PCT: 78.4 %
Neutrophil #: 4.2 x10 3/mm (ref 1.4–6.5)
Platelet: 244 x10 3/mm (ref 150–440)
RBC: 3.43 10*6/uL — AB (ref 3.80–5.20)
RDW: 17.7 % — ABNORMAL HIGH (ref 11.5–14.5)
WBC: 5.4 x10 3/mm (ref 3.6–11.0)

## 2014-09-15 LAB — URIC ACID: Uric Acid: 2.1 mg/dL — ABNORMAL LOW (ref 2.6–6.0)

## 2014-09-15 LAB — MAGNESIUM: MAGNESIUM: 1.5 mg/dL — AB

## 2014-09-16 LAB — CA 125: CA 125: 83.6 U/mL — ABNORMAL HIGH (ref 0.0–34.0)

## 2014-09-21 LAB — COMPREHENSIVE METABOLIC PANEL
ALK PHOS: 258 U/L — AB
ALT: 337 U/L — AB
ANION GAP: 9 (ref 7–16)
Albumin: 2.9 g/dL — ABNORMAL LOW (ref 3.4–5.0)
BILIRUBIN TOTAL: 1.5 mg/dL — AB (ref 0.2–1.0)
BUN: 19 mg/dL — ABNORMAL HIGH (ref 7–18)
CALCIUM: 9.8 mg/dL (ref 8.5–10.1)
CREATININE: 1.01 mg/dL (ref 0.60–1.30)
Chloride: 95 mmol/L — ABNORMAL LOW (ref 98–107)
Co2: 27 mmol/L (ref 21–32)
EGFR (African American): >60
EGFR (Non-African Amer.): 56 — ABNORMAL LOW
Glucose: 347 mg/dL — ABNORMAL HIGH (ref 65–99)
OSMOLALITY: 279 (ref 275–301)
Potassium: 4.3 mmol/L (ref 3.5–5.1)
SGOT(AST): 182 U/L — ABNORMAL HIGH (ref 15–37)
SODIUM: 131 mmol/L — AB (ref 136–145)
TOTAL PROTEIN: 7.2 g/dL (ref 6.4–8.2)

## 2014-09-21 LAB — MAGNESIUM: MAGNESIUM: 1.8 mg/dL

## 2014-09-29 ENCOUNTER — Ambulatory Visit: Payer: Self-pay | Admitting: Internal Medicine

## 2014-10-05 LAB — MAGNESIUM: Magnesium: 1.7 mg/dL — ABNORMAL LOW

## 2014-10-05 LAB — BASIC METABOLIC PANEL
ANION GAP: 8 (ref 7–16)
BUN: 13 mg/dL (ref 7–18)
CALCIUM: 10.1 mg/dL (ref 8.5–10.1)
CO2: 30 mmol/L (ref 21–32)
CREATININE: 1.07 mg/dL (ref 0.60–1.30)
Chloride: 98 mmol/L (ref 98–107)
EGFR (African American): >60
EGFR (Non-African Amer.): 53 — ABNORMAL LOW
Glucose: 254 mg/dL — ABNORMAL HIGH (ref 65–99)
Osmolality: 281 (ref 275–301)
POTASSIUM: 4.2 mmol/L (ref 3.5–5.1)
Sodium: 136 mmol/L (ref 136–145)

## 2014-10-05 LAB — HEPATIC FUNCTION PANEL A (ARMC)
AST: 101 U/L — AB (ref 15–37)
Albumin: 2.7 g/dL — ABNORMAL LOW (ref 3.4–5.0)
Alkaline Phosphatase: 510 U/L — ABNORMAL HIGH
BILIRUBIN DIRECT: 1.1 mg/dL — AB (ref 0.0–0.2)
BILIRUBIN TOTAL: 1.5 mg/dL — AB (ref 0.2–1.0)
SGPT (ALT): 122 U/L — ABNORMAL HIGH
Total Protein: 7.1 g/dL (ref 6.4–8.2)

## 2014-10-07 LAB — CA 125: CA 125: 142.4 U/mL — AB (ref 0.0–34.0)

## 2014-10-10 LAB — COMPREHENSIVE METABOLIC PANEL
ALK PHOS: 291 U/L — AB
ANION GAP: 8 (ref 7–16)
Albumin: 2.2 g/dL — ABNORMAL LOW (ref 3.4–5.0)
BUN: 10 mg/dL (ref 7–18)
Bilirubin,Total: 0.8 mg/dL (ref 0.2–1.0)
CALCIUM: 8.1 mg/dL — AB (ref 8.5–10.1)
Chloride: 96 mmol/L — ABNORMAL LOW (ref 98–107)
Co2: 28 mmol/L (ref 21–32)
Creatinine: 1.06 mg/dL (ref 0.60–1.30)
EGFR (Non-African Amer.): 53 — ABNORMAL LOW
Glucose: 190 mg/dL — ABNORMAL HIGH (ref 65–99)
OSMOLALITY: 269 (ref 275–301)
POTASSIUM: 3.4 mmol/L — AB (ref 3.5–5.1)
SGOT(AST): 30 U/L (ref 15–37)
SGPT (ALT): 43 U/L
Sodium: 132 mmol/L — ABNORMAL LOW (ref 136–145)
TOTAL PROTEIN: 6.2 g/dL — AB (ref 6.4–8.2)

## 2014-10-10 LAB — URINALYSIS, COMPLETE
BACTERIA: NONE SEEN
BILIRUBIN, UR: NEGATIVE
Ketone: NEGATIVE
NITRITE: NEGATIVE
PH: 6 (ref 4.5–8.0)
Protein: 30
RBC,UR: 2 /HPF (ref 0–5)
Specific Gravity: 1.01 (ref 1.003–1.030)
Squamous Epithelial: 2

## 2014-10-10 LAB — CBC WITH DIFFERENTIAL/PLATELET
BASOS ABS: 0 10*3/uL (ref 0.0–0.1)
Basophil %: 0.2 %
EOS PCT: 0.1 %
Eosinophil #: 0 10*3/uL (ref 0.0–0.7)
HCT: 27.7 % — ABNORMAL LOW (ref 35.0–47.0)
HGB: 9.1 g/dL — ABNORMAL LOW (ref 12.0–16.0)
Lymphocyte #: 0.7 10*3/uL — ABNORMAL LOW (ref 1.0–3.6)
Lymphocyte %: 7.1 %
MCH: 30.2 pg (ref 26.0–34.0)
MCHC: 33 g/dL (ref 32.0–36.0)
MCV: 91 fL (ref 80–100)
MONO ABS: 0.9 x10 3/mm (ref 0.2–0.9)
Monocyte %: 9.7 %
Neutrophil #: 8 10*3/uL — ABNORMAL HIGH (ref 1.4–6.5)
Neutrophil %: 82.9 %
Platelet: 249 10*3/uL (ref 150–440)
RBC: 3.03 10*6/uL — ABNORMAL LOW (ref 3.80–5.20)
RDW: 16.4 % — AB (ref 11.5–14.5)
WBC: 9.6 10*3/uL (ref 3.6–11.0)

## 2014-10-10 LAB — LIPASE, BLOOD: LIPASE: 49 U/L — AB (ref 73–393)

## 2014-10-10 LAB — TROPONIN I

## 2014-10-11 ENCOUNTER — Inpatient Hospital Stay: Payer: Self-pay | Admitting: Family Medicine

## 2014-10-12 LAB — CBC WITH DIFFERENTIAL/PLATELET
Basophil #: 0 10*3/uL (ref 0.0–0.1)
Basophil %: 0.4 %
EOS ABS: 0.1 10*3/uL (ref 0.0–0.7)
Eosinophil %: 1 %
HCT: 24.7 % — AB (ref 35.0–47.0)
HGB: 8.3 g/dL — AB (ref 12.0–16.0)
LYMPHS PCT: 7.1 %
Lymphocyte #: 0.5 10*3/uL — ABNORMAL LOW (ref 1.0–3.6)
MCH: 31 pg (ref 26.0–34.0)
MCHC: 33.4 g/dL (ref 32.0–36.0)
MCV: 93 fL (ref 80–100)
MONO ABS: 0.6 x10 3/mm (ref 0.2–0.9)
Monocyte %: 9.9 %
NEUTROS ABS: 5.4 10*3/uL (ref 1.4–6.5)
Neutrophil %: 81.6 %
Platelet: 178 10*3/uL (ref 150–440)
RBC: 2.66 10*6/uL — ABNORMAL LOW (ref 3.80–5.20)
RDW: 16.8 % — ABNORMAL HIGH (ref 11.5–14.5)
WBC: 6.6 10*3/uL (ref 3.6–11.0)

## 2014-10-12 LAB — BASIC METABOLIC PANEL
ANION GAP: 5 — AB (ref 7–16)
BUN: 9 mg/dL (ref 7–18)
CALCIUM: 7.9 mg/dL — AB (ref 8.5–10.1)
CHLORIDE: 106 mmol/L (ref 98–107)
CO2: 26 mmol/L (ref 21–32)
CREATININE: 0.8 mg/dL (ref 0.60–1.30)
EGFR (African American): >60
Glucose: 142 mg/dL — ABNORMAL HIGH (ref 65–99)
OSMOLALITY: 275 (ref 275–301)
Potassium: 3.4 mmol/L — ABNORMAL LOW (ref 3.5–5.1)
Sodium: 137 mmol/L (ref 136–145)

## 2014-10-12 LAB — URINE CULTURE

## 2014-10-13 LAB — BASIC METABOLIC PANEL
Anion Gap: 8 (ref 7–16)
BUN: 5 mg/dL — AB (ref 7–18)
CREATININE: 0.91 mg/dL (ref 0.60–1.30)
Calcium, Total: 8.2 mg/dL — ABNORMAL LOW (ref 8.5–10.1)
Chloride: 103 mmol/L (ref 98–107)
Co2: 28 mmol/L (ref 21–32)
EGFR (African American): >60
Glucose: 80 mg/dL (ref 65–99)
Osmolality: 274 (ref 275–301)
Potassium: 3 mmol/L — ABNORMAL LOW (ref 3.5–5.1)
SODIUM: 139 mmol/L (ref 136–145)

## 2014-10-13 LAB — CBC WITH DIFFERENTIAL/PLATELET
BASOS ABS: 0.2 10*3/uL — AB (ref 0.0–0.1)
Basophil %: 2.6 %
Eosinophil #: 0 10*3/uL (ref 0.0–0.7)
Eosinophil %: 0.3 %
HCT: 23.6 % — ABNORMAL LOW (ref 35.0–47.0)
HGB: 7.8 g/dL — AB (ref 12.0–16.0)
LYMPHS PCT: 7.4 %
Lymphocyte #: 0.5 10*3/uL — ABNORMAL LOW (ref 1.0–3.6)
MCH: 30.3 pg (ref 26.0–34.0)
MCHC: 33 g/dL (ref 32.0–36.0)
MCV: 92 fL (ref 80–100)
MONOS PCT: 10.1 %
Monocyte #: 0.6 x10 3/mm (ref 0.2–0.9)
NEUTROS PCT: 79.6 %
Neutrophil #: 4.9 10*3/uL (ref 1.4–6.5)
Platelet: 182 10*3/uL (ref 150–440)
RBC: 2.57 10*6/uL — AB (ref 3.80–5.20)
RDW: 16.1 % — ABNORMAL HIGH (ref 11.5–14.5)
WBC: 6.1 10*3/uL (ref 3.6–11.0)

## 2014-10-15 LAB — CULTURE, BLOOD (SINGLE)

## 2014-10-16 LAB — HEPATIC FUNCTION PANEL A (ARMC)
ALBUMIN: 2.6 g/dL — AB (ref 3.4–5.0)
ALK PHOS: 242 U/L — AB
AST: 26 U/L (ref 15–37)
BILIRUBIN DIRECT: 0.4 mg/dL — AB (ref 0.0–0.2)
Bilirubin,Total: 0.5 mg/dL (ref 0.2–1.0)
SGPT (ALT): 30 U/L
TOTAL PROTEIN: 7 g/dL (ref 6.4–8.2)

## 2014-10-16 LAB — MAGNESIUM: Magnesium: 1.6 mg/dL — ABNORMAL LOW

## 2014-10-16 LAB — POTASSIUM: Potassium: 4 mmol/L (ref 3.5–5.1)

## 2014-10-16 LAB — CANCER CENTER HEMOGLOBIN: HGB: 10.4 g/dL — ABNORMAL LOW (ref 12.0–16.0)

## 2014-10-16 LAB — CREATININE, SERUM
Creatinine: 1.02 mg/dL (ref 0.60–1.30)
EGFR (African American): >60
EGFR (Non-African Amer.): 56 — ABNORMAL LOW

## 2014-10-29 LAB — MAGNESIUM: Magnesium: 1.5 mg/dL — ABNORMAL LOW

## 2014-10-29 LAB — COMPREHENSIVE METABOLIC PANEL
ALBUMIN: 2.6 g/dL — AB (ref 3.4–5.0)
ALK PHOS: 232 U/L — AB
ALT: 24 U/L
Anion Gap: 9 (ref 7–16)
BILIRUBIN TOTAL: 0.4 mg/dL (ref 0.2–1.0)
BUN: 12 mg/dL (ref 7–18)
CALCIUM: 9.2 mg/dL (ref 8.5–10.1)
CREATININE: 1.02 mg/dL (ref 0.60–1.30)
Chloride: 94 mmol/L — ABNORMAL LOW (ref 98–107)
Co2: 30 mmol/L (ref 21–32)
EGFR (Non-African Amer.): 56 — ABNORMAL LOW
Glucose: 277 mg/dL — ABNORMAL HIGH (ref 65–99)
OSMOLALITY: 276 (ref 275–301)
Potassium: 4.2 mmol/L (ref 3.5–5.1)
SGOT(AST): 18 U/L (ref 15–37)
Sodium: 133 mmol/L — ABNORMAL LOW (ref 136–145)
TOTAL PROTEIN: 7.2 g/dL (ref 6.4–8.2)

## 2014-10-30 ENCOUNTER — Ambulatory Visit: Payer: Self-pay | Admitting: Internal Medicine

## 2014-11-05 LAB — HEPATIC FUNCTION PANEL A (ARMC)
AST: 19 U/L (ref 15–37)
Albumin: 2.4 g/dL — ABNORMAL LOW (ref 3.4–5.0)
Alkaline Phosphatase: 201 U/L — ABNORMAL HIGH
BILIRUBIN TOTAL: 0.3 mg/dL (ref 0.2–1.0)
Bilirubin, Direct: 0.2 mg/dL (ref 0.0–0.2)
SGPT (ALT): 20 U/L
Total Protein: 6.7 g/dL (ref 6.4–8.2)

## 2014-11-05 LAB — POTASSIUM: Potassium: 3.9 mmol/L (ref 3.5–5.1)

## 2014-11-05 LAB — CREATININE, SERUM
Creatinine: 1 mg/dL (ref 0.60–1.30)
EGFR (African American): >60
EGFR (Non-African Amer.): 57 — ABNORMAL LOW

## 2014-11-05 LAB — MAGNESIUM: MAGNESIUM: 1.8 mg/dL

## 2014-11-05 LAB — CANCER CENTER HEMOGLOBIN: HGB: 9.7 g/dL — ABNORMAL LOW (ref 12.0–16.0)

## 2014-11-06 LAB — CA 125: CA 125: 304.7 U/mL — AB (ref 0.0–34.0)

## 2014-11-17 ENCOUNTER — Inpatient Hospital Stay: Payer: Self-pay | Admitting: Family Medicine

## 2014-11-17 LAB — INFLUENZA A,B,H1N1 - PCR (ARMC)
H1N1 flu by pcr: NOT DETECTED
INFLAPCR: NEGATIVE
Influenza B By PCR: NEGATIVE

## 2014-11-17 LAB — COMPREHENSIVE METABOLIC PANEL
ALT: 163 U/L — AB
AST: 208 U/L — AB (ref 15–37)
Albumin: 2.3 g/dL — ABNORMAL LOW (ref 3.4–5.0)
Alkaline Phosphatase: 572 U/L — ABNORMAL HIGH
Anion Gap: 10 (ref 7–16)
BILIRUBIN TOTAL: 3.3 mg/dL — AB (ref 0.2–1.0)
BUN: 7 mg/dL (ref 7–18)
CALCIUM: 8.8 mg/dL (ref 8.5–10.1)
CO2: 28 mmol/L (ref 21–32)
Chloride: 96 mmol/L — ABNORMAL LOW (ref 98–107)
Creatinine: 1.08 mg/dL (ref 0.60–1.30)
EGFR (Non-African Amer.): 52 — ABNORMAL LOW
Glucose: 194 mg/dL — ABNORMAL HIGH (ref 65–99)
Osmolality: 272 (ref 275–301)
POTASSIUM: 3.4 mmol/L — AB (ref 3.5–5.1)
Sodium: 134 mmol/L — ABNORMAL LOW (ref 136–145)
TOTAL PROTEIN: 6.6 g/dL (ref 6.4–8.2)

## 2014-11-17 LAB — URINALYSIS, COMPLETE
BACTERIA: NONE SEEN
BILIRUBIN, UR: NEGATIVE
Glucose,UR: 50 mg/dL (ref 0–75)
KETONE: NEGATIVE
Leukocyte Esterase: NEGATIVE
Nitrite: NEGATIVE
Ph: 6 (ref 4.5–8.0)
Protein: 30
RBC,UR: <1 /HPF (ref 0–5)
SPECIFIC GRAVITY: 1.009 (ref 1.003–1.030)
SQUAMOUS EPITHELIAL: NONE SEEN
WBC UR: 1 /HPF (ref 0–5)

## 2014-11-17 LAB — PHOSPHORUS: Phosphorus: 1.6 mg/dL — ABNORMAL LOW (ref 2.5–4.9)

## 2014-11-17 LAB — PROTIME-INR
INR: 1.1
PROTHROMBIN TIME: 14.2 s (ref 11.5–14.7)

## 2014-11-17 LAB — TROPONIN I: Troponin-I: <0.02 ng/mL

## 2014-11-17 LAB — CBC
HCT: 27.8 % — AB (ref 35.0–47.0)
HGB: 9 g/dL — ABNORMAL LOW (ref 12.0–16.0)
MCH: 27.3 pg (ref 26.0–34.0)
MCHC: 32.2 g/dL (ref 32.0–36.0)
MCV: 85 fL (ref 80–100)
Platelet: 281 10*3/uL (ref 150–440)
RBC: 3.28 10*6/uL — ABNORMAL LOW (ref 3.80–5.20)
RDW: 18.4 % — ABNORMAL HIGH (ref 11.5–14.5)
WBC: 11.3 10*3/uL — ABNORMAL HIGH (ref 3.6–11.0)

## 2014-11-17 LAB — MAGNESIUM: Magnesium: 1.4 mg/dL — ABNORMAL LOW

## 2014-11-18 LAB — CBC WITH DIFFERENTIAL/PLATELET
BASOS PCT: 0.1 %
Basophil #: 0 10*3/uL (ref 0.0–0.1)
EOS PCT: 0.1 %
Eosinophil #: 0 10*3/uL (ref 0.0–0.7)
HCT: 24.2 % — ABNORMAL LOW (ref 35.0–47.0)
HGB: 7.7 g/dL — ABNORMAL LOW (ref 12.0–16.0)
LYMPHS ABS: 0.2 10*3/uL — AB (ref 1.0–3.6)
Lymphocyte %: 2.5 %
MCH: 27.1 pg (ref 26.0–34.0)
MCHC: 32 g/dL (ref 32.0–36.0)
MCV: 85 fL (ref 80–100)
Monocyte #: 0.6 x10 3/mm (ref 0.2–0.9)
Monocyte %: 8.3 %
Neutrophil #: 6.7 10*3/uL — ABNORMAL HIGH (ref 1.4–6.5)
Neutrophil %: 89 %
Platelet: 215 10*3/uL (ref 150–440)
RBC: 2.84 10*6/uL — ABNORMAL LOW (ref 3.80–5.20)
RDW: 17.9 % — AB (ref 11.5–14.5)
WBC: 7.5 10*3/uL (ref 3.6–11.0)

## 2014-11-18 LAB — COMPREHENSIVE METABOLIC PANEL
ALBUMIN: 1.7 g/dL — AB (ref 3.4–5.0)
ALK PHOS: 442 U/L — AB
ALT: 117 U/L — AB
AST: 119 U/L — AB (ref 15–37)
Anion Gap: 10 (ref 7–16)
BUN: 9 mg/dL (ref 7–18)
Bilirubin,Total: 2.8 mg/dL — ABNORMAL HIGH (ref 0.2–1.0)
CALCIUM: 8 mg/dL — AB (ref 8.5–10.1)
Chloride: 105 mmol/L (ref 98–107)
Co2: 24 mmol/L (ref 21–32)
Creatinine: 0.99 mg/dL (ref 0.60–1.30)
EGFR (African American): >60
EGFR (Non-African Amer.): 58 — ABNORMAL LOW
GLUCOSE: 170 mg/dL — AB (ref 65–99)
OSMOLALITY: 280 (ref 275–301)
Potassium: 3.4 mmol/L — ABNORMAL LOW (ref 3.5–5.1)
Sodium: 139 mmol/L (ref 136–145)
Total Protein: 5.2 g/dL — ABNORMAL LOW (ref 6.4–8.2)

## 2014-11-18 LAB — PROTIME-INR
INR: 1.3
Prothrombin Time: 15.7 secs — ABNORMAL HIGH (ref 11.5–14.7)

## 2014-11-18 LAB — TSH: Thyroid Stimulating Horm: 0.711 u[IU]/mL

## 2014-11-18 LAB — MAGNESIUM: Magnesium: 2.9 mg/dL — ABNORMAL HIGH

## 2014-11-19 LAB — COMPREHENSIVE METABOLIC PANEL
AST: 79 U/L — AB (ref 15–37)
Albumin: 1.6 g/dL — ABNORMAL LOW (ref 3.4–5.0)
Alkaline Phosphatase: 427 U/L — ABNORMAL HIGH
Anion Gap: 8 (ref 7–16)
BUN: 8 mg/dL (ref 7–18)
Bilirubin,Total: 2.6 mg/dL — ABNORMAL HIGH (ref 0.2–1.0)
CALCIUM: 7.7 mg/dL — AB (ref 8.5–10.1)
CHLORIDE: 105 mmol/L (ref 98–107)
Co2: 25 mmol/L (ref 21–32)
Creatinine: 0.83 mg/dL (ref 0.60–1.30)
EGFR (African American): >60
EGFR (Non-African Amer.): >60
GLUCOSE: 129 mg/dL — AB (ref 65–99)
Osmolality: 276 (ref 275–301)
POTASSIUM: 3.3 mmol/L — AB (ref 3.5–5.1)
SGPT (ALT): 87 U/L — ABNORMAL HIGH
Sodium: 138 mmol/L (ref 136–145)
Total Protein: 5.1 g/dL — ABNORMAL LOW (ref 6.4–8.2)

## 2014-11-19 LAB — HEPATIC FUNCTION PANEL A (ARMC): Bilirubin, Direct: 2.2 mg/dL — ABNORMAL HIGH (ref 0.0–0.2)

## 2014-11-19 LAB — URINE CULTURE

## 2014-11-19 LAB — CBC WITH DIFFERENTIAL/PLATELET
BASOS PCT: 0.3 %
Basophil #: 0 10*3/uL (ref 0.0–0.1)
Eosinophil #: 0 10*3/uL (ref 0.0–0.7)
Eosinophil %: 0.4 %
HCT: 24.4 % — AB (ref 35.0–47.0)
HGB: 7.7 g/dL — AB (ref 12.0–16.0)
LYMPHS ABS: 0.4 10*3/uL — AB (ref 1.0–3.6)
LYMPHS PCT: 4.9 %
MCH: 26.8 pg (ref 26.0–34.0)
MCHC: 31.8 g/dL — AB (ref 32.0–36.0)
MCV: 84 fL (ref 80–100)
Monocyte #: 0.6 x10 3/mm (ref 0.2–0.9)
Monocyte %: 6.8 %
NEUTROS PCT: 87.6 %
Neutrophil #: 7.9 10*3/uL — ABNORMAL HIGH (ref 1.4–6.5)
Platelet: 233 10*3/uL (ref 150–440)
RBC: 2.89 10*6/uL — ABNORMAL LOW (ref 3.80–5.20)
RDW: 18 % — AB (ref 11.5–14.5)
WBC: 9 10*3/uL (ref 3.6–11.0)

## 2014-11-19 LAB — LIPASE, BLOOD: LIPASE: 26 U/L — AB (ref 73–393)

## 2014-11-20 LAB — CLOSTRIDIUM DIFFICILE(ARMC)

## 2014-11-20 LAB — CBC WITH DIFFERENTIAL/PLATELET
BASOS ABS: 0 10*3/uL (ref 0.0–0.1)
Basophil %: 0.2 %
EOS ABS: 0.1 10*3/uL (ref 0.0–0.7)
Eosinophil %: 1.4 %
HCT: 22.5 % — ABNORMAL LOW (ref 35.0–47.0)
HGB: 7.4 g/dL — AB (ref 12.0–16.0)
LYMPHS PCT: 3.2 %
Lymphocyte #: 0.2 10*3/uL — ABNORMAL LOW (ref 1.0–3.6)
MCH: 27.6 pg (ref 26.0–34.0)
MCHC: 32.9 g/dL (ref 32.0–36.0)
MCV: 84 fL (ref 80–100)
Monocyte #: 0.5 x10 3/mm (ref 0.2–0.9)
Monocyte %: 7.6 %
NEUTROS PCT: 87.6 %
Neutrophil #: 5.5 10*3/uL (ref 1.4–6.5)
Platelet: 220 10*3/uL (ref 150–440)
RBC: 2.68 10*6/uL — ABNORMAL LOW (ref 3.80–5.20)
RDW: 18.6 % — AB (ref 11.5–14.5)
WBC: 6.3 10*3/uL (ref 3.6–11.0)

## 2014-11-20 LAB — COMPREHENSIVE METABOLIC PANEL
ALK PHOS: 475 U/L — AB
ALT: 75 U/L — AB
ANION GAP: 8 (ref 7–16)
Albumin: 1.4 g/dL — ABNORMAL LOW (ref 3.4–5.0)
BUN: 5 mg/dL — ABNORMAL LOW (ref 7–18)
Bilirubin,Total: 3.8 mg/dL — ABNORMAL HIGH (ref 0.2–1.0)
CHLORIDE: 106 mmol/L (ref 98–107)
Calcium, Total: 7.8 mg/dL — ABNORMAL LOW (ref 8.5–10.1)
Co2: 26 mmol/L (ref 21–32)
Creatinine: 0.72 mg/dL (ref 0.60–1.30)
EGFR (African American): >60
EGFR (Non-African Amer.): >60
Glucose: 77 mg/dL (ref 65–99)
OSMOLALITY: 275 (ref 275–301)
POTASSIUM: 2.8 mmol/L — AB (ref 3.5–5.1)
SGOT(AST): 85 U/L — ABNORMAL HIGH (ref 15–37)
SODIUM: 140 mmol/L (ref 136–145)
Total Protein: 4.7 g/dL — ABNORMAL LOW (ref 6.4–8.2)

## 2014-11-21 LAB — CBC WITH DIFFERENTIAL/PLATELET
Basophil #: 0 10*3/uL (ref 0.0–0.1)
Basophil %: 0.5 %
EOS PCT: 1.6 %
Eosinophil #: 0.1 10*3/uL (ref 0.0–0.7)
HCT: 22.1 % — ABNORMAL LOW (ref 35.0–47.0)
HGB: 7 g/dL — ABNORMAL LOW (ref 12.0–16.0)
LYMPHS ABS: 0.2 10*3/uL — AB (ref 1.0–3.6)
Lymphocyte %: 3.3 %
MCH: 26.7 pg (ref 26.0–34.0)
MCHC: 31.8 g/dL — ABNORMAL LOW (ref 32.0–36.0)
MCV: 84 fL (ref 80–100)
MONO ABS: 0.5 x10 3/mm (ref 0.2–0.9)
Monocyte %: 8.9 %
Neutrophil #: 5 10*3/uL (ref 1.4–6.5)
Neutrophil %: 85.7 %
PLATELETS: 218 10*3/uL (ref 150–440)
RBC: 2.64 10*6/uL — AB (ref 3.80–5.20)
RDW: 18.1 % — ABNORMAL HIGH (ref 11.5–14.5)
WBC: 5.8 10*3/uL (ref 3.6–11.0)

## 2014-11-21 LAB — COMPREHENSIVE METABOLIC PANEL
ALK PHOS: 523 U/L — AB
ALT: 68 U/L — AB
AST: 84 U/L — AB (ref 15–37)
Albumin: 1.4 g/dL — ABNORMAL LOW (ref 3.4–5.0)
Anion Gap: 7 (ref 7–16)
BILIRUBIN TOTAL: 4.9 mg/dL — AB (ref 0.2–1.0)
BUN: 4 mg/dL — ABNORMAL LOW (ref 7–18)
CALCIUM: 7.9 mg/dL — AB (ref 8.5–10.1)
CHLORIDE: 107 mmol/L (ref 98–107)
CO2: 26 mmol/L (ref 21–32)
CREATININE: 0.76 mg/dL (ref 0.60–1.30)
Glucose: 122 mg/dL — ABNORMAL HIGH (ref 65–99)
Osmolality: 278 (ref 275–301)
Potassium: 3.3 mmol/L — ABNORMAL LOW (ref 3.5–5.1)
Sodium: 140 mmol/L (ref 136–145)
Total Protein: 4.6 g/dL — ABNORMAL LOW (ref 6.4–8.2)

## 2014-11-21 LAB — WBCS, STOOL

## 2014-11-22 LAB — BASIC METABOLIC PANEL
Anion Gap: 7 (ref 7–16)
BUN: 4 mg/dL — ABNORMAL LOW (ref 7–18)
CHLORIDE: 99 mmol/L (ref 98–107)
CO2: 31 mmol/L (ref 21–32)
Calcium, Total: 8.5 mg/dL (ref 8.5–10.1)
Creatinine: 0.86 mg/dL (ref 0.60–1.30)
EGFR (African American): >60
EGFR (Non-African Amer.): >60
GLUCOSE: 158 mg/dL — AB (ref 65–99)
Osmolality: 274 (ref 275–301)
Potassium: 3.4 mmol/L — ABNORMAL LOW (ref 3.5–5.1)
SODIUM: 137 mmol/L (ref 136–145)

## 2014-11-22 LAB — CBC WITH DIFFERENTIAL/PLATELET
BANDS NEUTROPHIL: 3 %
EOS PCT: 2 %
HCT: 28 % — ABNORMAL LOW (ref 35.0–47.0)
HGB: 9.3 g/dL — AB (ref 12.0–16.0)
Lymphocytes: 4 %
MCH: 27.5 pg (ref 26.0–34.0)
MCHC: 33.2 g/dL (ref 32.0–36.0)
MCV: 83 fL (ref 80–100)
MONOS PCT: 6 %
Myelocyte: 1 %
PLATELETS: 260 10*3/uL (ref 150–440)
RBC: 3.37 10*6/uL — AB (ref 3.80–5.20)
RDW: 17.9 % — ABNORMAL HIGH (ref 11.5–14.5)
SEGMENTED NEUTROPHILS: 84 %
WBC: 7.4 10*3/uL (ref 3.6–11.0)

## 2014-11-22 LAB — STOOL CULTURE

## 2014-11-22 LAB — CULTURE, BLOOD (SINGLE)

## 2014-11-22 LAB — HEPATIC FUNCTION PANEL A (ARMC)
ALBUMIN: 1.6 g/dL — AB (ref 3.4–5.0)
ALT: 64 U/L — AB
AST: 83 U/L — AB (ref 15–37)
Alkaline Phosphatase: 612 U/L — ABNORMAL HIGH
BILIRUBIN DIRECT: 6.3 mg/dL — AB (ref 0.0–0.2)
Bilirubin,Total: 7.7 mg/dL — ABNORMAL HIGH (ref 0.2–1.0)
Total Protein: 5.3 g/dL — ABNORMAL LOW (ref 6.4–8.2)

## 2014-11-23 LAB — CBC WITH DIFFERENTIAL/PLATELET
BASOS ABS: 0 10*3/uL (ref 0.0–0.1)
BASOS PCT: 0.4 %
Eosinophil #: 0.1 10*3/uL (ref 0.0–0.7)
Eosinophil %: 1.4 %
HCT: 27 % — ABNORMAL LOW (ref 35.0–47.0)
HGB: 9.1 g/dL — ABNORMAL LOW (ref 12.0–16.0)
LYMPHS ABS: 0.8 10*3/uL — AB (ref 1.0–3.6)
LYMPHS PCT: 11.4 %
MCH: 27.9 pg (ref 26.0–34.0)
MCHC: 33.7 g/dL (ref 32.0–36.0)
MCV: 83 fL (ref 80–100)
MONOS PCT: 11.4 %
Monocyte #: 0.8 x10 3/mm (ref 0.2–0.9)
Neutrophil #: 5.2 10*3/uL (ref 1.4–6.5)
Neutrophil %: 75.4 %
Platelet: 256 10*3/uL (ref 150–440)
RBC: 3.27 10*6/uL — ABNORMAL LOW (ref 3.80–5.20)
RDW: 17.8 % — AB (ref 11.5–14.5)
WBC: 6.9 10*3/uL (ref 3.6–11.0)

## 2014-11-23 LAB — BASIC METABOLIC PANEL
ANION GAP: 7 (ref 7–16)
BUN: 5 mg/dL — ABNORMAL LOW (ref 7–18)
CALCIUM: 8.4 mg/dL — AB (ref 8.5–10.1)
CHLORIDE: 100 mmol/L (ref 98–107)
CO2: 32 mmol/L (ref 21–32)
Creatinine: 0.83 mg/dL (ref 0.60–1.30)
EGFR (African American): >60
EGFR (Non-African Amer.): >60
GLUCOSE: 132 mg/dL — AB (ref 65–99)
OSMOLALITY: 277 (ref 275–301)
Potassium: 3.3 mmol/L — ABNORMAL LOW (ref 3.5–5.1)
Sodium: 139 mmol/L (ref 136–145)

## 2014-11-23 LAB — HEPATIC FUNCTION PANEL A (ARMC)
ALK PHOS: 598 U/L — AB
AST: 68 U/L — AB (ref 15–37)
Albumin: 1.5 g/dL — ABNORMAL LOW (ref 3.4–5.0)
Bilirubin, Direct: 6.2 mg/dL — ABNORMAL HIGH (ref 0.0–0.2)
Bilirubin,Total: 7.6 mg/dL — ABNORMAL HIGH (ref 0.2–1.0)
SGPT (ALT): 49 U/L
Total Protein: 5 g/dL — ABNORMAL LOW (ref 6.4–8.2)

## 2014-11-24 LAB — COMPREHENSIVE METABOLIC PANEL
ALK PHOS: 640 U/L — AB (ref 46–116)
ALT: 47 U/L (ref 14–63)
Albumin: 1.5 g/dL — ABNORMAL LOW (ref 3.4–5.0)
Anion Gap: 8 (ref 7–16)
BILIRUBIN TOTAL: 7.9 mg/dL — AB (ref 0.2–1.0)
BUN: 6 mg/dL — ABNORMAL LOW (ref 7–18)
CALCIUM: 8.2 mg/dL — AB (ref 8.5–10.1)
CHLORIDE: 99 mmol/L (ref 98–107)
CO2: 29 mmol/L (ref 21–32)
Creatinine: 0.8 mg/dL (ref 0.60–1.30)
EGFR (African American): >60
Glucose: 217 mg/dL — ABNORMAL HIGH (ref 65–99)
Osmolality: 276 (ref 275–301)
Potassium: 3.4 mmol/L — ABNORMAL LOW (ref 3.5–5.1)
SGOT(AST): 72 U/L — ABNORMAL HIGH (ref 15–37)
SODIUM: 136 mmol/L (ref 136–145)
Total Protein: 5.1 g/dL — ABNORMAL LOW (ref 6.4–8.2)

## 2014-11-24 LAB — CBC WITH DIFFERENTIAL/PLATELET
Bands: 3 %
HCT: 26.4 % — ABNORMAL LOW (ref 35.0–47.0)
HGB: 8.8 g/dL — ABNORMAL LOW (ref 12.0–16.0)
Lymphocytes: 3 %
MCH: 27.7 pg (ref 26.0–34.0)
MCHC: 33.2 g/dL (ref 32.0–36.0)
MCV: 83 fL (ref 80–100)
Metamyelocyte: 2 %
Monocytes: 12 %
Myelocyte: 1 %
PLATELETS: 244 10*3/uL (ref 150–440)
RBC: 3.17 10*6/uL — ABNORMAL LOW (ref 3.80–5.20)
RDW: 18.7 % — AB (ref 11.5–14.5)
Segmented Neutrophils: 79 %
WBC: 6.7 10*3/uL (ref 3.6–11.0)

## 2014-11-25 LAB — CBC WITH DIFFERENTIAL/PLATELET
Bands: 1 %
HCT: 27.5 % — ABNORMAL LOW (ref 35.0–47.0)
HGB: 9.1 g/dL — AB (ref 12.0–16.0)
Lymphocytes: 10 %
MCH: 27.6 pg (ref 26.0–34.0)
MCHC: 33.1 g/dL (ref 32.0–36.0)
MCV: 83 fL (ref 80–100)
MONOS PCT: 6 %
Platelet: 257 10*3/uL (ref 150–440)
RBC: 3.29 10*6/uL — AB (ref 3.80–5.20)
RDW: 19.2 % — ABNORMAL HIGH (ref 11.5–14.5)
SEGMENTED NEUTROPHILS: 83 %
WBC: 8.8 10*3/uL (ref 3.6–11.0)

## 2014-11-25 LAB — COMPREHENSIVE METABOLIC PANEL
ALK PHOS: 677 U/L — AB (ref 46–116)
ANION GAP: 6 — AB (ref 7–16)
AST: 80 U/L — AB (ref 15–37)
Albumin: 1.6 g/dL — ABNORMAL LOW (ref 3.4–5.0)
BUN: 5 mg/dL — ABNORMAL LOW (ref 7–18)
Bilirubin,Total: 6.4 mg/dL — ABNORMAL HIGH (ref 0.2–1.0)
CALCIUM: 8.7 mg/dL (ref 8.5–10.1)
CHLORIDE: 99 mmol/L (ref 98–107)
CO2: 31 mmol/L (ref 21–32)
Creatinine: 0.69 mg/dL (ref 0.60–1.30)
EGFR (African American): >60
EGFR (Non-African Amer.): >60
Glucose: 144 mg/dL — ABNORMAL HIGH (ref 65–99)
Osmolality: 272 (ref 275–301)
Potassium: 3.3 mmol/L — ABNORMAL LOW (ref 3.5–5.1)
SGPT (ALT): 50 U/L (ref 14–63)
Sodium: 136 mmol/L (ref 136–145)
Total Protein: 5.3 g/dL — ABNORMAL LOW (ref 6.4–8.2)

## 2014-11-26 LAB — COMPREHENSIVE METABOLIC PANEL
ANION GAP: 9 (ref 7–16)
Albumin: 1.9 g/dL — ABNORMAL LOW (ref 3.4–5.0)
Alkaline Phosphatase: 608 U/L — ABNORMAL HIGH (ref 46–116)
BUN: 7 mg/dL (ref 7–18)
Bilirubin,Total: 4.2 mg/dL — ABNORMAL HIGH (ref 0.2–1.0)
CHLORIDE: 97 mmol/L — AB (ref 98–107)
CO2: 30 mmol/L (ref 21–32)
Calcium, Total: 9.1 mg/dL (ref 8.5–10.1)
Creatinine: 0.81 mg/dL (ref 0.60–1.30)
EGFR (African American): >60
Glucose: 129 mg/dL — ABNORMAL HIGH (ref 65–99)
OSMOLALITY: 272 (ref 275–301)
Potassium: 3.3 mmol/L — ABNORMAL LOW (ref 3.5–5.1)
SGOT(AST): 44 U/L — ABNORMAL HIGH (ref 15–37)
SGPT (ALT): 38 U/L (ref 14–63)
SODIUM: 136 mmol/L (ref 136–145)
Total Protein: 6.3 g/dL — ABNORMAL LOW (ref 6.4–8.2)

## 2014-11-26 LAB — CBC WITH DIFFERENTIAL/PLATELET
Basophil #: 0 10*3/uL (ref 0.0–0.1)
Basophil %: 0.1 %
EOS ABS: 0.1 10*3/uL (ref 0.0–0.7)
Eosinophil %: 1.1 %
HCT: 26.6 % — AB (ref 35.0–47.0)
HGB: 8.9 g/dL — ABNORMAL LOW (ref 12.0–16.0)
LYMPHS ABS: 0.5 10*3/uL — AB (ref 1.0–3.6)
Lymphocyte %: 4.2 %
MCH: 28.1 pg (ref 26.0–34.0)
MCHC: 33.5 g/dL (ref 32.0–36.0)
MCV: 84 fL (ref 80–100)
Monocyte #: 1 x10 3/mm — ABNORMAL HIGH (ref 0.2–0.9)
Monocyte %: 8.9 %
NEUTROS ABS: 9.2 10*3/uL — AB (ref 1.4–6.5)
Neutrophil %: 85.7 %
Platelet: 273 10*3/uL (ref 150–440)
RBC: 3.18 10*6/uL — ABNORMAL LOW (ref 3.80–5.20)
RDW: 19.9 % — ABNORMAL HIGH (ref 11.5–14.5)
WBC: 10.7 10*3/uL (ref 3.6–11.0)

## 2014-11-26 LAB — PROTIME-INR
INR: 1
Prothrombin Time: 12.6 secs (ref 11.5–14.7)

## 2014-11-26 LAB — HEPATIC FUNCTION PANEL A (ARMC): Bilirubin, Direct: 2.9 mg/dL — ABNORMAL HIGH (ref 0.0–0.2)

## 2014-11-27 LAB — COMPREHENSIVE METABOLIC PANEL
ALK PHOS: 449 U/L — AB (ref 46–116)
ANION GAP: 6 — AB (ref 7–16)
Albumin: 1.6 g/dL — ABNORMAL LOW (ref 3.4–5.0)
BUN: 9 mg/dL (ref 7–18)
Bilirubin,Total: 2.7 mg/dL — ABNORMAL HIGH (ref 0.2–1.0)
CALCIUM: 8.5 mg/dL (ref 8.5–10.1)
CO2: 31 mmol/L (ref 21–32)
Chloride: 96 mmol/L — ABNORMAL LOW (ref 98–107)
Creatinine: 0.72 mg/dL (ref 0.60–1.30)
Glucose: 146 mg/dL — ABNORMAL HIGH (ref 65–99)
Osmolality: 268 (ref 275–301)
Potassium: 3.4 mmol/L — ABNORMAL LOW (ref 3.5–5.1)
SGOT(AST): 32 U/L (ref 15–37)
SGPT (ALT): 25 U/L (ref 14–63)
SODIUM: 133 mmol/L — AB (ref 136–145)
Total Protein: 5.3 g/dL — ABNORMAL LOW (ref 6.4–8.2)

## 2014-11-27 LAB — HEMOGLOBIN: HGB: 8 g/dL — AB (ref 12.0–16.0)

## 2014-11-30 ENCOUNTER — Ambulatory Visit: Payer: Self-pay | Admitting: Internal Medicine

## 2014-12-03 LAB — CBC CANCER CENTER
BASOS ABS: 0 x10 3/mm (ref 0.0–0.1)
Basophil %: 0.4 %
Eosinophil #: 0.1 x10 3/mm (ref 0.0–0.7)
Eosinophil %: 0.9 %
HCT: 27.9 % — ABNORMAL LOW (ref 35.0–47.0)
HGB: 9 g/dL — AB (ref 12.0–16.0)
LYMPHS ABS: 0.4 x10 3/mm — AB (ref 1.0–3.6)
Lymphocyte %: 6.7 %
MCH: 27.3 pg (ref 26.0–34.0)
MCHC: 32.4 g/dL (ref 32.0–36.0)
MCV: 84 fL (ref 80–100)
MONO ABS: 0.5 x10 3/mm (ref 0.2–0.9)
MONOS PCT: 8.6 %
Neutrophil #: 5.3 x10 3/mm (ref 1.4–6.5)
Neutrophil %: 83.4 %
Platelet: 379 x10 3/mm (ref 150–440)
RBC: 3.31 10*6/uL — ABNORMAL LOW (ref 3.80–5.20)
RDW: 20.4 % — AB (ref 11.5–14.5)
WBC: 6.3 x10 3/mm (ref 3.6–11.0)

## 2014-12-03 LAB — COMPREHENSIVE METABOLIC PANEL
ALK PHOS: 353 U/L — AB (ref 46–116)
ALT: 31 U/L (ref 14–63)
Albumin: 2.3 g/dL — ABNORMAL LOW (ref 3.4–5.0)
Anion Gap: 8 (ref 7–16)
BUN: 11 mg/dL (ref 7–18)
Bilirubin,Total: 1.5 mg/dL — ABNORMAL HIGH (ref 0.2–1.0)
CALCIUM: 8.8 mg/dL (ref 8.5–10.1)
CO2: 30 mmol/L (ref 21–32)
CREATININE: 0.99 mg/dL (ref 0.60–1.30)
Chloride: 98 mmol/L (ref 98–107)
EGFR (African American): >60
GFR CALC NON AF AMER: 58 — AB
Glucose: 290 mg/dL — ABNORMAL HIGH (ref 65–99)
OSMOLALITY: 282 (ref 275–301)
Potassium: 3.7 mmol/L (ref 3.5–5.1)
SGOT(AST): 30 U/L (ref 15–37)
Sodium: 136 mmol/L (ref 136–145)
Total Protein: 6.7 g/dL (ref 6.4–8.2)

## 2014-12-03 LAB — MAGNESIUM: Magnesium: 1.6 mg/dL — ABNORMAL LOW

## 2014-12-05 LAB — CA 125: CA 125: 474.5 U/mL — AB (ref 0.0–34.0)

## 2014-12-29 ENCOUNTER — Ambulatory Visit
Admit: 2014-12-29 | Disposition: A | Discharge status: Home / Self Care | Payer: Self-pay | Attending: Internal Medicine | Admitting: Internal Medicine

## 2015-01-29 ENCOUNTER — Ambulatory Visit
Admit: 2015-01-29 | Disposition: A | Discharge status: Home / Self Care | Payer: Self-pay | Attending: Internal Medicine | Admitting: Internal Medicine

## 2015-01-29 LAB — COMPREHENSIVE METABOLIC PANEL
ALBUMIN: 3.1 g/dL — AB
ALK PHOS: 122 U/L
ALT: 249 U/L — AB
ANION GAP: 8 (ref 7–16)
BILIRUBIN TOTAL: 0.9 mg/dL
BUN: 19 mg/dL
CHLORIDE: 102 mmol/L
Calcium, Total: 9.2 mg/dL
Co2: 21 mmol/L — ABNORMAL LOW
Creatinine: 1.26 mg/dL — ABNORMAL HIGH
EGFR (African American): 48 — ABNORMAL LOW
EGFR (Non-African Amer.): 41 — ABNORMAL LOW
Glucose: 218 mg/dL — ABNORMAL HIGH
Potassium: 4 mmol/L
SGOT(AST): 154 U/L — ABNORMAL HIGH
Sodium: 131 mmol/L — ABNORMAL LOW
TOTAL PROTEIN: 7.2 g/dL

## 2015-01-29 LAB — CBC CANCER CENTER
Basophil #: 0 x10 3/mm (ref 0.0–0.1)
Basophil %: 0.5 %
EOS ABS: 0.1 x10 3/mm (ref 0.0–0.7)
EOS PCT: 0.7 %
HCT: 29 % — AB (ref 35.0–47.0)
HGB: 9.5 g/dL — ABNORMAL LOW (ref 12.0–16.0)
LYMPHS ABS: 0.4 x10 3/mm — AB (ref 1.0–3.6)
Lymphocyte %: 4 %
MCH: 27.4 pg (ref 26.0–34.0)
MCHC: 32.6 g/dL (ref 32.0–36.0)
MCV: 84 fL (ref 80–100)
MONO ABS: 0.5 x10 3/mm (ref 0.2–0.9)
Monocyte %: 4.9 %
Neutrophil #: 8.9 x10 3/mm — ABNORMAL HIGH (ref 1.4–6.5)
Neutrophil %: 89.9 %
Platelet: 318 x10 3/mm (ref 150–440)
RBC: 3.46 10*6/uL — AB (ref 3.80–5.20)
RDW: 18.6 % — ABNORMAL HIGH (ref 11.5–14.5)
WBC: 9.9 x10 3/mm (ref 3.6–11.0)

## 2015-01-29 LAB — MAGNESIUM: Magnesium: 1.7 mg/dL

## 2015-02-16 NOTE — Op Note (Signed)
PATIENT NAME:  [name], [name] MR#:  [id number] DATE OF BIRTH:  1935/05/23  DATE OF PROCEDURE:  08/21/2012  PREOPERATIVE DIAGNOSIS: Endometrial cancer, status post chemotherapy with Port-A-Cath no longer needing to be used.   POSTOPERATIVE DIAGNOSIS: Endometrial cancer, status post chemotherapy with Port-A-Cath no longer needing to be used.   PROCEDURE PERFORMED:  Removal of Port-A-Cath.   SURGEON: Festus BarrenJason Dew, MD   ANESTHESIA: Local with moderate conscious sedation.   ESTIMATED BLOOD LOSS: Minimal.   INDICATION FOR PROCEDURE: A 80 year old white female with endometrial cancer. She has completed her course of chemotherapy and desires to have her Port-A-Cath removed.   DESCRIPTION OF PROCEDURE: The patient was brought to the Vascular Interventional Radiology Suite. The area was  sterilely prepped and draped, and a sterile surgical field was created. The previous incision was reopened after local anesthesia was obtained, and we dissected out and grasped the catheter with hemostats. The port was then dissected free using electrocautery and blunt dissection without difficulty, and the catheter and port were removed in their entirety. The catheter tract was closed with a figure-of-eight Vicryl, subcutaneous tissue was closed with 3-0 Vicryl, and the skin was closed with 4-0 Monocryl. Dermabond was placed as a dressing. The patient tolerated the procedure well.  ____________________________ Annice NeedyJason S. Dew, MD jsd:cbb D: 08/21/2012 11:07:44 ET T: 08/21/2012 11:20:37 ET JOB#: [id number]  cc: Annice NeedyJason S. Dew, MD, <Dictator> Maxine GlennBrigitte E. Miller, MD Annice NeedyJASON S DEW MD ELECTRONICALLY SIGNED 08/25/2012 9:14

## 2015-02-20 NOTE — Consult Note (Signed)
PATIENT NAME:  [name], [name] MR#:  [id number] DATE OF BIRTH:  01-Jun-1935  DATE OF CONSULTATION:  11/19/2013  CONSULTING PHYSICIAN:  Knute Neu. Lorre Nick, MD  NOTE: The patient was seen and consulted on January 21. The consultation was rendered a narrative given on February 8. That dictation has multiple dictation anomalies that cannot be transcribed. This is a re-dictation for correction of multiple errors.   [name] was seen on January 21. She is a 80 year old patient who was initially admitted with signs and symptoms of intestinal obstruction. She has a history of endometrial cancer, has been followed by Dr. Wendy Poet in the [facility]. She initially had a stage IIIC, grade 1 adenocarcinoma of the uterus. She underwent a TAH/BSO procedure in March 2011. Was treated with paclitaxel and carboplatin x 3, then irradiation, then additional 3 treatments. Her last visit with oncologist was in November 2014. This admission, CT scan shows recurrent cancer, including liver metastasis, and also an abnormally narrowed segment in the distal colon that was felt to be possibly a colon primary, so a GI consultation and a sigmoidoscopy was planned. I discussed the case with surgery and GI.   PAST MEDICAL HISTORY: Included hypertension, peripheral neuropathy from chemotherapy, vitamin D deficiency, appendectomy, cholecystectomy, and the previous GU surgeries.   MEDICATIONS: At the time of dictation included gabapentin, calcium, tramadol, glyburide, and lovastatin.   ALLERGIES: BIAXIN, DILANTIN, PENICILLIN, SULFA, AND ADHESIVE.   SOCIAL HISTORY: No alcohol or tobacco.   FAMILY HISTORY: Noncontributory, but does include lung cancer.   SYSTEM REVIEW: The patient was comfortable, except for symptoms of bowel obstruction. When I saw her, she was not vomiting. She had also not had a bowel movement since admission. There was general weakness. Had reported some weight loss. No headache or visual disturbances.  No ear or jaw pain. No dizziness. Some chronic hearing loss. No cough, wheezing, shortness of breath, or hemoptysis. No chest pain or palpitations. No extremity edema. No dysuria or hematuria. No bleeding or bruising. No rashes. No hot or cold intolerance.   PHYSICAL EXAMINATION: VITAL SIGNS: Stable.  GENERAL: There was some pallor, no acute distress.  LYMPH NODES: Not palpably enlarged in the neck, supraclavicular, submandibular, or axillae.  LUNGS: Clear. No wheezing or rales.  HEART: Regular.  ABDOMEN: Not tender.  EXTREMITIES: No extremity edema.  NEUROLOGIC: Grossly nonfocal.  MUSCULOSKELETAL: No swollen or deformed joints.  SKIN: No skin rashes or lesions.   LABORATORY AND RADIOLOGICAL DATA: On admission, creatinine 0.78. Normal liver chemistries. Hemoglobin was 14.9; platelets were 220. CT scan of the abdomen and pelvis showed distal colon wall thickening and obstruction. There were lesions in the liver and there was thickening of the mesentery, omentum consistent with intra-abdominal recurrence and liver metastatic disease.   IMPRESSION AND PLAN: Bowel obstruction, most likely due to recurrent endometrial cancer. There was some possibility of a primary colonic malignancy. A sigmoidoscopy was planned. I recommended that tumor markers be checked. It was planned that the patient was going to need diverting surgery. From the oncology point of view, after tissue diagnosis established, if confirmed recurrence of the same initial GYN cancer, I would plan, after healing of the wounds, platinum-based treatment with the same or similar combination doublet that was given initially. If the patient had a primary colon cancer, there would have been an alternative treatment plan, but that was not demonstrated. Plan is additionally to place a port and then follow up in the Cancer Center in 10 days to 2  weeks.   ____________________________ Knute Neuobert G. Lorre NickGittin, MD rgg:jcm D: 12/12/2013 18:19:00  ET T: 12/12/2013 18:32:01 ET JOB#: [id number]  cc: Knute Neuobert G. Lorre NickGittin, MD, <Dictator>Jylan Loeza G. Lorre NickGittin, MD, <Dictator> Marin RobertsOBERT G Itzael Liptak MD ELECTRONICALLY SIGNED 12/30/2013 14:20

## 2015-02-20 NOTE — Consult Note (Signed)
Chief Complaint:  Subjective/Chief Complaint NO ACUTE COMPLAINTS  STRONGER   VITAL SIGNS/ANCILLARY NOTES: **Vital Signs.:   28-Jan-15 14:50  Vital Signs Type Routine  Pulse Pulse 101  Respirations Respirations 20  Systolic BP Systolic BP 268  Diastolic BP (mmHg) Diastolic BP (mmHg) 83  Mean BP 99  Pulse Ox % Pulse Ox % 98  Pulse Ox Activity Level  At rest  Oxygen Delivery 2L   Brief Assessment:  GEN well developed   Additional Physical Exam NEURO GROSSLY NON FOCAL  ALERT AND COOPERATIVE   Lab Results: Routine Chem:  28-Jan-15 05:09   Glucose, Serum  159  BUN  6  Creatinine (comp) 0.67  Sodium, Serum  134  Potassium, Serum 3.9  Chloride, Serum 101  CO2, Serum 30  Calcium (Total), Serum 9.1  Anion Gap  3  Osmolality (calc) 269  eGFR (African American) >60  eGFR (Non-African American) >60 (eGFR values <62m/min/1.73 m2 may be an indication of chronic kidney disease (CKD). Calculated eGFR is useful in patients with stable renal function. The eGFR calculation will not be reliable in acutely ill patients when serum creatinine is changing rapidly. It is not useful in  patients on dialysis. The eGFR calculation may not be applicable to patients at the low and high extremes of body sizes, pregnant women, and vegetarians.)   Assessment/Plan:  Assessment/Plan:  Assessment SEEN EARLIER IN AFTERNOON TODAY, CONTINUES TO IMPROVE .   Plan WOULD WANT TO TX AS SOON AS PORT PLACED AND WOUNDS OK AS PER SURGERY, DELAY RISKS NEW/RECURRENT GI COMPLICATIONS FROM TUMOR SPREAD. WOULD LOOK AT PORT LACEMENT NEXT WEEK, AND IF DOING WELL START TX APPROX 10 DAYS POST OP..Marland KitchenNITIALLY PLAN TAXANE AND CARBO, RENAL FUNCTION GOOD, BUT EXISTING NEUROPATHY OF LOWER EXT, FROM PRIOR TX, LIKELY WILL AFFECT TX CHOICE AND INTENSITY..Dominica SeverinF/U 1 WEEK FROM DISCHARGE   Electronic Signatures: GDallas Schimke(MD)  (Signed 29-Jan-15 22:24)  Authored: Chief Complaint, VITAL SIGNS/ANCILLARY NOTES, Brief  Assessment, Lab Results, Assessment/Plan   Last Updated: 29-Jan-15 22:24 by GDallas Schimke(MD)

## 2015-02-20 NOTE — Discharge Summary (Signed)
PATIENT NAME:  [name], [name] MR#:  [id number] DATE OF BIRTH:  January 09, 1935  DATE OF ADMISSION:  10/11/2014 DATE OF DISCHARGE:  10/13/2014  DISCHARGE DIAGNOSES: 1.  Metastatic endometrial cancer.  2.  Sepsis that is ruled out.  3.  Adult onset diabetes.   DISCHARGE MEDICATIONS: 1.  Vitamin D2 50,000 international units once a week.  2.  Letrozole 2.5 mg p.o. daily. 3.  Gabapentin 100 mg p.o. b.i.d.  4.  Glimepiride 4 mg p.o. b.i.d. with meals.  5.  Januvia 100 mg daily.  6.  Magnesium oxide 400 mg p.o. b.i.d. 7.  Pantoprazole 40 mg p.o. daily prior to meals.  8.  Klor-Con 10 mEq p.o. b.i.d.   CONSULTS: None.   DIAGNOSTIC DATA: The patient had a CT scan of the abdomen and pelvis that did show worsening metastatic disease invading the liver, pancreas and peritoneum.   Pertinent labs: On day of discharge, sodium 139, potassium 3, creatinine 0.91, and glucose 80. White blood cell count 6.1, hemoglobin 7.8, and platelets 182,000.   Blood culture showed no growth to date and urine culture showed contaminant.   BRIEF HOSPITAL COURSE:  1.  Sepsis. The patient initially came in with hypotension, fever and chills. The patient was initially placed on ceftriaxone with concern for possible urosepsis. Urine culture was negative; it did show contaminant. Blood cultures showed no growth to date. She did spike low grade fevers. CT of the abdomen showed worsening metastatic disease, but no source of infection. Chest x-ray was negative as well. Therefore, antibiotics were discontinued. It was thought that her symptoms are more related to her metastatic cancer.  2.  Metastatic endometrial cancer, worsening based on CT of the abdomen and now symptomatic with low grade fevers and chills. The patient is to follow up with Dr. Lorre NickGittin. Will continue on the Femara for now. She remains a FULL code at this time.  3.  Adult-onset diabetes. It remained stable. We stopped the insulin. Continue on the glimepiride. It  appears that her insulin production is likely decreasing, mainly due to the invasion of the metastatic cancer to her pancreas.   DISPOSITION: She is in stable condition and will be discharged to home. Will need to follow up with Dr. Lorre NickGittin this week. Need to recheck the hemoglobin as she has acute on chronic anemia, also needs to follow the potassium level as that remains low as well. Otherwise, she is in stable condition for discharge. She will follow up with me in the clinic as scheduled on the 23rd.   ____________________________ Marisue IvanKanhka Amayrany Cafaro, MD kl:sb D: 10/13/2014 09:03:10 ET T: 10/13/2014 11:59:13 ET JOB#: [id number]  cc: Marisue IvanKanhka Amillion [place], MD, <Dictator> Marisue IvanKANHKA Denim Start MD ELECTRONICALLY SIGNED 10/20/2014 15:28

## 2015-02-20 NOTE — Discharge Summary (Signed)
PATIENT NAME:  [name], [name] MR#:  [id number] DATE OF BIRTH:  [birth year]  DATE OF ADMISSION:  11/20/2013 DATE OF DISCHARGE:  11/28/2013   DISCHARGE DIAGNOSES:  1. Metastatic cancer of the abdomen.  2. Bowel mass, status post resection with ostomy in place.  3. Adult-onset diabetes.  4. Peripheral neuropathy.   DISCHARGE MEDICATIONS:  1. Glimepiride 4 mg p.o. b.i.d. with meals, hold if CBGs less than 120.  2. Gabapentin 100 mg p.o. daily as needed for neuropathy.  3. Ondansetron 4 mg p.o. t.i.d. as needed for nausea and vomiting.  4. Acetaminophen 325 mg 1 tab p.o. q.4 hours as needed for pain or fever.   CONSULTATIONS: Surgery, hematology/oncology, GI.   PROCEDURES: The patient had small bowel resection and ostomy in place per Dr. Egbert GaribaldiBird.   PERTINENT LABORATORIES AND STUDIES: The patient had a CT of the abdomen that showed a 7 cm distal colon wall thickening without inflammatory changes, distal bowel obstruction, concern for primary colonic neoplasm, also showed ill-defined mass within the liver and spleen, mesentery, omental mass with necrotic right external iliac chain nodule conglomeration. Also has a CA-125 of 113.7, CEA of 3.3. On day of discharge, sodium 133, potassium 3.8, creatinine 0.74, glucose of 171. White blood cell count 5.4, hemoglobin 13.2 and platelets of 204.   BRIEF HOSPITAL COURSE:  1. Abdominal discomfort: The patient initially came in with abdominal discomfort. Was found on CT to have an abdominal mass with metastatic disease. Right now, the patient has concerns for metastatic cancer with history of uterine cancer. It is thought that this was likely recurrent uterine cancer with a CA-125 of 113.7. It is not likely colon cancer given a CEA of 3.3. Further evaluation per hematology/oncology. Dr. Lorre NickGittin did visit the patient. Plan for potential chemotherapy and port placement as an outpatient. The patient did have a small bowel resection per Dr. Egbert GaribaldiBird and has an ostomy in  place. The patient received education and is tolerating her orals and has no acute pain at this time. She has medications for nausea and vomiting. Will follow up with Dr. Egbert GaribaldiBird this week to reassess and also follow up with Dr. Lorre NickGittin. I will see her in 10 days.  2. Other chronic medical issues are stable at this time. No changes to those regimens.   DISPOSITION: She is in stable condition to be discharged home.   FOLLOWUP: As described above.   ____________________________ Marisue IvanKanhka Neeko Pharo, MD kl:lb D: 11/28/2013 13:44:09 ET T: 11/28/2013 14:03:05 ET JOB#: [id number]  cc: Marisue IvanKanhka Lue [place], MD, <Dictator> Marisue IvanKANHKA Jonathyn Carothers MD ELECTRONICALLY SIGNED 12/19/2013 10:51

## 2015-02-20 NOTE — Consult Note (Signed)
Chief Complaint:  Subjective/Chief Complaint events noted.  Will follow at a distance.  Dr Bluford Kaufmannh covering this weekend if needed. Discussed briefly with Dr Egbert GaribaldiBird.   Electronic Signatures: Barnetta ChapelSkulskie, Martin (MD)  (Signed 23-Jan-15 17:32)  Authored: Chief Complaint   Last Updated: 23-Jan-15 17:32 by Barnetta ChapelSkulskie, Martin (MD)

## 2015-02-20 NOTE — Consult Note (Signed)
PATIENT NAME:  [name], [name] MR#:  [id number] DATE OF BIRTH:  03-Feb-1935  DATE OF CONSULTATION:  11/19/2013  REFERRING PHYSICIAN:   CONSULTING PHYSICIAN:  Christena Deem, MD  REASON FOR CONSULTATION: Distal colon obstruction.   HISTORY OF PRESENT ILLNESS: [name] is a pleasant 80 year old Caucasian female who came to the Emergency Room early this morning with complaint of lower abdominal pain, nausea and vomiting. She states that she has had some lower abdominal pain with increased and loud borborygmi/stomach noises that started after a recent "cleansing" with MiraLax and Dulcolax. She apparently was having some problems with passing stools although she states she has not really been constipated per se. She has noted a change in her bowel habits to include pencil-thin stools that come and go as well as a recent x-ray showing increased volume of stool on the x-ray. She states that she has been having the change of bowel habits over the period of the past couple of months. More recently, she has had increasing lower abdominal discomfort, then beginning nausea and vomiting about a week or so ago. She went to the Emergency Room this past Sunday and had an x-ray and was sent home at that time. The pain increased last night with increasing emesis and nausea and she came back to the Emergency Room. She does have some occasional heartburn but no dysphagia. Her last bowel movement was perhaps yesterday but then, when she thought about it, it might have been the day before. Again, stools have been variable in consistency. She does have a personal history of uterine cancer in about 2010 for which she underwent surgery, chemotherapy and radiation treatment. Her last colonoscopy was done on 07/14/2012. At that time, the colonoscopy was quite difficult to do. A standard colonoscope would not go through the area and an upper scope was used to the region of about the ascending colon. There was a finding of diffuse  appearance of radiation/mucosal inflammation in the rectum and rectosigmoid area. Again, the distal sigmoid was not passable and a smaller scope was used to go above this region. The area above the region of the inflammation in the sigmoid colon was normal in appearance. There was no evidence of colitis or other lesions. There was a finding of some internal hemorrhoids as well. Currently, the patient denies much in the way of abdominal pain. She is not nauseated currently. It is of note that over the period of the past year and a half or so, she has been having a slowly increasing CA-125 value through May of 2014. At that time, it was 19.9; however, her last CA-125 was done September 23, 2013, this showing an increase to 66.1 on a scale of 0 to 34. She states she has also been having a "gassy feeling." When I discussed this further with her, there has been some abdominal bloating, mostly in the lower region. She denies seeing any blood in the stool; however, there has been a "fleshy-appearing" mucosy substance.   PAST MEDICAL HISTORY:  Includes: 1.  Hypertension.  2.  Type 2 diabetes, on oral agents.  3.  Hyperlipidemia.  4.  History of uterine cancer as noted above, status post surgery, chemotherapy and radiation.  5.  Peripheral neuropathy.  6.  Osteopenia.  7.  Vitamin D deficiency.  8.  History of craniotomy.  9.  Hysterectomy.  10.  Cholecystectomy.  11.  Appendectomy.   REVIEW OF SYSTEMS: Ten  systems reviewed, pertinent for increasing lower extremity edema  over the past couple of months. Agree with review of systems per admission history and physical otherwise.   OUTPATIENT MEDICATIONS:  Include:  Calcium supplement 200 mg 1 twice a day, gabapentin 100 mg once a day, glimepiride 4 mg twice a day, lovastatin 40 mg once a day, ondansetron 4 mg one 3 times a day, tramadol 50 mg 1 q.4 hours p.r.n.   ALLERGIES: SHE IS ALLERGIC TO BIAXIN, DILANTIN, PENICILLIN, SULFA DRUGS AND ADHESIVE.    PHYSICAL EXAMINATION: VITAL SIGNS: Temperature is 97.9, pulse 74, respirations 15, blood pressure 137/65, pulse ox was 97% to 93%.  GENERAL: She is a 80 year old Caucasian female, appears younger than her stated age.  HEENT: Normocephalic, atraumatic. Eyes are anicteric. Nose septum midline, no lesions. Oropharynx no lesions.  NECK: Supple. No JVD. No lymphadenopathy. No thyromegaly.  HEART: Regular rate and rhythm without rub or gallop.  LUNGS: Bilaterally clear.  ABDOMEN: Soft. She is minimally uncomfortable to palpation across the lower abdomen. There is a fullness centrally in the lower abdomen extending toward the left side. There is a mild tympany although no real distention noted on percussion in all 4 quadrants, mostly upper abdomen and left lower quadrant. There is no rebound. Bowel sounds are positive and somewhat hyperactive. There are no high-pitched bowel sounds.   LABORATORY, DIAGNOSTIC AND RADIOLOGICAL DATA: Include the following:  Metabolic panel with a glucose of 135, otherwise normal. Her lipase was actually low at 70. A hepatic profile was normal. Her hemogram also normal with a white count of 9.0, H and H 14.9/44.4, platelet count of 220. She had a urinalysis that showed 30 mg/dL protein, trace leukocyte esterase, negative nitrite. On January 18th she had a 3-way abdominal film, this showing air-fluid levels throughout the colon consistent with a history of a diarrheal illness. Colon was mildly distended diffusely, distal colonic obstruction lesion was "theoretically possible" but considered less likely. She had a CT scan on today, on the 21st, this showing a "7 cm segment of circumferential distal colon wall thickening without inflammatory changes resulting in distal colonic bowel obstruction." A statement of given the additional findings, this is concerning for primary colonic neoplasm. There were also findings of ill-defined masses within the liver and spleen as well as a mass  within the splenic hilum most consistent with metastatic disease. New mesenteric and omental masses concerning for metastatic disease as well as a necrotic right external iliac chain nodal conglomeration.    ASSESSMENT: The patient is presenting with possible partial versus near complete distal colonic obstruction in the level of the rectosigmoid region. She did have a colonoscopy relatively recently showing this area to be inflamed, however, no overt mass noted. 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 Concern for whether this could be a primary neoplastic lesion of the colon versus local metastasis from an otherwise pelvic lesion. It is of note that there are multiple lesions noted in the liver and spleen and mesentery concerning for possible metastasis as well. The patient with a history of uterine cancer with noted finding of increasing CA-125 as well.   RECOMMENDATIONS: 1.  Repeat CA-125 and CEA are pending.  2.  Recommend flexible sigmoidoscopy. I will do this with an enema preparation as I am concerned that she may have a close-to-complete obstruction. Hopefully, we will be able to access the bottom of this lesion although she it has been known that she has a very tortuous colon in this region as well. I have discussed the risks, benefits  and complications of flexible sigmoidoscopy to include but not limited to bleeding, infection, perforation and the risk of sedation and she wishes to proceed. Will arrange for this tomorrow afternoon. I will need to do this with sedation. Case discussed with Dr. Burnadette PopLinthavong.   ____________________________ Christena DeemMartin U. Skulskie, MD mus:cs D: 11/19/2013 14:42:24 ET T: 11/19/2013 15:38:21 ET JOB#: [id number]  cc: Christena DeemMartin U. Skulskie, MD, <Dictator> Christena DeemMARTIN U SKULSKIE MD ELECTRONICALLY SIGNED 12/09/2013 14:35

## 2015-02-20 NOTE — Consult Note (Signed)
Brief Consult Note: Diagnosis: Distal LBO, hx uterine CA.   Patient was seen by consultant.   Discussed with Attending MD.   Comments: will need flex sig, PET scan and possibly diversion based al these studies.  Will follow, thx for consult.  Electronic Signatures: Natale LayBird, Jaquese Irving (MD)  (Signed 21-Jan-15 22:16)  Authored: Brief Consult Note   Last Updated: 21-Jan-15 22:16 by Natale LayBird, Jeson Camacho (MD)

## 2015-02-20 NOTE — Op Note (Signed)
PATIENT NAME:  [name], Katherine K MR#:  [id number] DATE OF BIRTH:  August 06, 1935  DATE OF PROCEDURE:  11/21/2013  PREOPERATIVE DIAGNOSIS:  Extrinsic malignant compression, sigmoid colon, with resulting large bowel obstruction, a history of uterine adenocarcinoma.   POSTOPERATIVE DIAGNOSIS:  Extrinsic malignant compression, sigmoid colon, with resulting large bowel obstruction, a history of uterine adenocarcinoma.   PROCEDURE PERFORMED: 1.  Exploratory laparotomy.  2.  Construction of loop transverse colostomy.  3.  Lysis of adhesions.  4.  Excision of abdominal wall peritoneal-based metastases.  5.  Small bowel resection with primary anastomosis.   SPECIMENS:  As described above as per frozen section consistent with adenocarcinoma. Final pathology pending.  ESTIMATED BLOOD LOSS:  25 mL.   DESCRIPTION OF PROCEDURE:  With informed consent, supine position, general oral endotracheal anesthesia, a Foley catheter was placed under sterile technique. A digital vaginal examination demonstrated no evidence of mass. The patient's abdomen was widely prepped and draped with ChloraPrep solution. A time-out was observed.   The previous midline incision was opened sharply with a scalpel and carried down through musculofascial layers with sharp dissection to enter the peritoneal cavity. There were significant omental adhesions to the anterior abdominal wall.   On the abdominal wall in the previous incision underneath the fascia was a 1 to 2 cm peritoneal-based nodule, which was excised with electrocautery and submitted for frozen section and consistent with the findings as described above. Additionally, in the wound there was a loop of bowel that was densely adherent to the fascia, the previous incision and intimately associated with this nodule. In the process of taking this bowel down off the abdominal wall, because of its significant adhesion to it, a large enterotomy was fashioned.   Because of its proximity  to the tumor, I elected to perform a small bowel resection. The bowel was divided on either side of the enterotomy with 2 fires of the GIA-75 stapler and the intervening mesentery was then sequentially scored, clamped, cut and tied utilizing #0 Vicryl suture. A side-to-side functional end-to-end anastomosis was fashioned between these 2 limbs by aligning them along their antimesenteric border, excising the corner of each staple line and introducing each limb with the GIA-75 stapler. The end of the bowel was then closed with a TA-60 stapler. The staple lines were reinforced, anti-traction stitches were placed. The mesenteric defect was closed.   Attention was then turned to the transverse colon, which was massively dilated. The omentum was reflected off the transverse colon in the avascular plane. Significant adhesions in the right upper quadrant needed to be freed due to the previous open cholecystectomy. With sufficient mobility to the entire transverse colon, both from the right side and from the left side as well as from its transverse mesocolon, a cruciate incision was made in the skin allowing for a circular excision of skin. Cruciate-fashion incisions in the anterior and posterior fascia with the rectus muscle splitting were fashioned and the bowel was brought up through this utilizing a red rubber Roxan Hockeyobinson as an ostomy bridge. The bowel was then secured to the anterior fascia at 4 points with 3-0 silk seromuscular suture.   The ostomy bridge was placed outside the ostomy appliance.   The abdomen at this point was copiously irrigated. Hemostasis appeared adequate on the operative field and the wound was then closed from the extremes of the wound utilizing a running #1 looped PDS. The subcutaneous tissues were then irrigated. The wound was then excluded. The loop colostomy was then matured  by opening the tinea along its linear orientation and performing a Brooke colostomy utilizing 3-0 chromic suture. The  ostomy appliance was placed. Sterile dressings were applied. The patient was then subsequently extubated and taken to the recovery room in stable and satisfactory condition by anesthesia services.   ____________________________ Redge Gainer Egbert Garibaldi, MD mab:jm D: 11/22/2013 08:59:23 ET T: 11/22/2013 16:27:07 ET JOB#: [id number]  cc: Loraine Leriche A. Egbert Garibaldi, MD, <Dictator> Maxine Glenn, MD Demetrius Charity Wendie Simmer, MD Marisue Ivan, MD Raynald Kemp MD ELECTRONICALLY SIGNED 11/25/2013 8:37

## 2015-02-20 NOTE — H&P (Signed)
PATIENT NAME:  [name], [name] MR#:  [id number] DATE OF BIRTH:  05/08/1935  DATE OF ADMISSION:  11/18/2013  REFERRING PHYSICIAN: Enedina Finnerandolph N. Manson PasseyBrown, MD  PRIMARY CARE PHYSICIAN: Marisue IvanKanhka Linthavong, MD  CHIEF COMPLAINT: Nausea, vomiting, abdominal pain.   HISTORY OF PRESENT ILLNESS: This is a 80 year old female with significant past medical history of hyperlipidemia, diabetes mellitus, history of recurrent cancer, status post chemotherapy and radiation, who presents with complaints of abdominal pain, nausea and vomiting. The patient reports her symptoms have been going on for a few weeks. Recently seen by her PCP, who did a KUB without any acute findings, so gave her some laxative and cleansing regimen. The patient reports over the last couple days, she started to have some nausea and vomiting, multiple episodes, and crampy abdominal pain which she could not tolerate, which prompted her to come to ED. In ED, the patient received multiple dosages of Phenergan and Zofran, which did not resolve her nausea and vomiting. She had CT abdomen and pelvis with IV contrast which did show evidence of 7 cm segment of circumferential distal colon wall thickening without inflammatory changes, resulting in distal colonic bowel obstruction and ill-defined masses in the liver, spleen and mesentery and omental masses concerning for metastatic disease. The patient has been followed regularly by GI. She has been followed by Dr. Mechele CollinElliott. Her most recent colonoscopy she had in December 2013 by Dr. Mechele CollinElliott. Given the fact that the patient's symptoms did not resolve with p.r.n. nausea medications and due to the fact that it seems she is having obstruction, where she will need to be n.p.o. and be on IV fluids, hospitalist service was requested to admit the patient.   PAST MEDICAL HISTORY:  1. Hypertension.  2. Diabetes mellitus.  3. Hyperlipidemia.  4. History of uterine cancer, status post chemotherapy and radiation.  5.  Peripheral neuropathy.  6. Osteopenia.  7. Vitamin D deficiency.   PAST SURGICAL HISTORY:  1. Appendectomy.  2. Cholecystectomy.  3. Craniotomy.  4. Hysterectomy.   HOME MEDICATIONS:  1. Gabapentin 100 mg oral at bedtime.  2. Calcium 600 with vitamin D 400 units 1 tablet oral 2 times a day.  3. Tramadol 50 mg oral 3 times a day as needed for pain.  4. Glyburide 4 mg oral 2 times a day.  5. Lovastatin 40 mg oral at bedtime.   ALLERGIES:  1. BIAXIN.  2. DILANTIN.  3. PENICILLIN.  4. SULFA DRUGS.  5. ADHESIVE.   SOCIAL HISTORY: The patient is married, lives with her husband, who has dementia. No smoking. No alcohol. No illicit drug use.   FAMILY HISTORY: Father died at age of 78 of MI. Mother had history of lung cancer.   REVIEW OF SYSTEMS:  CONSTITUTIONAL: Denies fever, chills. Complains of fatigue, weakness, weight loss and poor appetite.  EYES: Denies blurry vision, double vision, inflammation, glaucoma.  ENT: Denies tinnitus, ear pain, hearing loss, epistaxis. RESPIRATORY: Denies cough, wheezing, hemoptysis, dyspnea.  CARDIOVASCULAR: Denies chest pain, orthopnea, edema, palpitations.  GASTROINTESTINAL: Complains of nausea, vomiting, abdominal pain, constipation. Denies hematemesis, melena, jaundice, rectal bleed.  GENITOURINARY: Denies dysuria, hematuria, renal colic.  ENDOCRINE: Denies polyuria or polydipsia, heat or cold intolerance.  HEMATOLOGIC: Denies anemia, easy bruising, bleeding diathesis.  INTEGUMENTARY: Denies acne, rash or skin lesion.  MUSCULOSKELETAL: Denies any arthritis, cramps, swelling or gout.  NEUROLOGIC: Denies CVA, TIA, headache, tremors.  PSYCHIATRIC: Denies anxiety, insomnia, depression.   PHYSICAL EXAMINATION:  VITAL SIGNS: Temperature 97.9, pulse 71, respiratory rate 18, blood  pressure 150/74, saturating 95% on room air.  GENERAL: A well-nourished female who looks comfortable in bed, in no apparent distress.  HEENT: Head atraumatic,  normocephalic. Pupils equally reactive to light. Pink conjunctivae. Anicteric sclerae. Moist oral mucosa.  NECK: Supple. No thyromegaly. No JVD.  CHEST: Good entry bilaterally. No wheezing, rales or rhonchi.  CARDIOVASCULAR: S1 and S2 heard. No rubs, murmur or gallops.  ABDOMEN: Soft, nontender, nondistended. Hyperperistaltic bowel sounds were heard in the left abdomen.  EXTREMITIES: No edema. No clubbing. No cyanosis.  PSYCHIATRIC: Appropriate affect. Awake, alert x3. Intact judgment and insight.  NEUROLOGIC: Cranial nerves grossly intact. Motor 5 out of 5. No focal deficits.  MUSCULOSKELETAL: No joint effusion or erythema.  SKIN: Normal skin turgor. Warm and dry.  PERTINENT LABORATORIES: Glucose 135, BUN 11, creatinine 0.78, sodium 136, potassium 4, chloride 99, CO2 30, ALT 17, AST 21, alkaline phosphatase 105. White blood cells 9, hemoglobin 14.9, hematocrit 44.4, platelets 220. Urinalysis showing trace leukocyte esterase and 3 white blood cells.   IMAGING STUDIES: CT abdomen and pelvis with IV contrast showing 7 cm segment of circumferential distal colon wall thickening, without inflammatory changes, resulting in distal colonic bowel obstruction. Given the additional finding, this is concerning for a primary colonic neoplasm and ill-defined masses within the liver, spleen and spleen hilum, mesentery and omentum.  ASSESSMENT AND PLAN:  1. Intractable nausea and vomiting. This is most likely due to metastatic colon neoplasm as per  finding on the CT abdomen and pelvis. The patient will be kept n.p.o. Will be kept on IV fluids. Will consult surgery. As well, will consult oncology service and GI service. At this point, the patient's symptom is mainly nausea. If she continues to have vomiting, will insert NG tube and will have it on low intermittent suction. Will keep her on p.r.n. pain and nausea medication.  2. History of diabetes mellitus. Will hold all hypoglycemic agents as her p.o. intake is  unpredictable. Will keep to check on her fingersticks, and if needed, we can put her on some sliding scale.  3. History of hyperlipidemia, hypertension and peripheral neuropathy. The patient is currently n.p.o. She can be resumed on these meds when she is more stable and tolerates p.o. intake.  4. Deep vein thrombosis prophylaxis. Subcutaneous heparin.   CODE STATUS: Discussed with the patient. She has a living will. Her husband used to be her healthcare power of attorney, but currently has dementia. She reports her son and daughter would be her healthcare power of attorney, and for the time being, she wants to be full code, but she is considering changing her code status.   TOTAL TIME SPENT ON ADMISSION AND PATIENT CARE: 50 minutes.   ____________________________ Starleen Arms, MD dse:lb D: 11/19/2013 05:37:35 ET T: 11/19/2013 05:56:10 ET JOB#: [id number]  cc: Starleen Arms, MD, <Dictator> DAWOOD Teena Irani MD ELECTRONICALLY SIGNED 11/27/2013 0:04

## 2015-02-20 NOTE — Consult Note (Signed)
Brief Consult Note: Diagnosis: distal colonic obstruction/partial obstruction, abnormal CT.   Patient was seen by consultant.   Consult note dictated.   Recommend to proceed with surgery or procedure.   Orders entered.   Discussed with Attending MD.   Comments: Please see full GI consult [phone]#[id number].  Patietn with h/o uterine cancer with surgery/radiation and chemotherapy 2010, presenting with lower abdominalpain and n/v, abnormal ct wiht possible obstructive lesion in the distal sigmoid and some thickening of the ileum, with concern for metastatic disease.   Recommend flexible sigmoidoscopy tomorrow with only enema prep.  Further recs to follow.  discussed with Dr Burnadette PopLinthavong.  Electronic Signatures: Barnetta ChapelSkulskie, Martin (MD)  (Signed 21-Jan-15 14:46)  Authored: Brief Consult Note   Last Updated: 21-Jan-15 14:46 by Barnetta ChapelSkulskie, Martin (MD)

## 2015-02-20 NOTE — H&P (Signed)
PATIENT NAME:  [name], [name] MR#:  [id number] DATE OF BIRTH:  01-01-1935  DATE OF ADMISSION:  10/11/2014  REFERRING DOCTOR: Lurena Joiner L. Shaune Pollack, MD  PRIMARY CARE DOCTOR: Marisue Ivan, MD of [facility].  ADMITTING DOCTOR: Crissie Figures, MD  PRIMARY ONCOLOGIST: Knute Neu. Gittin, MD   CHIEF COMPLAINT: 1.  Chills with a fever of 103 degrees noted at home. 2.  Generalized weakness for the past 2 days.   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 The patient has been having generalized weakness for the past 1 to 2 days and has been having decreased oral intake and was noted to have a temperature of 101 degrees Fahrenheit with fever and chills. She called her oncologist, who advised her to come to the Emergency Room for further evaluation. In the Emergency Room, the patient was evaluated by the ED physician and was noted to have a temperature of 102 degrees Fahrenheit and the workup showed urinalysis with leukocyte esterase positive and WBC. The patient received  IV fluids for generalized weakness and when she stood up, she felt dizzy. Hence, hospitalist service was consulted for further evaluation and management in view of UTI and generalized weakness with dizziness. The patient denies any chest pain, shortness of breath. No nausea, no vomiting, no diarrhea, no dysuria. She does have a colostomy which has been there for quite some time and is without any new problems. The patient was started on IV Rocephin by the ED physician.   PAST MEDICAL HISTORY: 1.  Endometrial carcinoma  status post surgery and chemotherapy.  2.  Recurrent metastatic mesenteric carcinoma with liver metastases status post colostomy.  3.  Hyperlipidemia.  4.  Diabetes mellitus type 2.  5.  Hypertension.  6.  Peripheral neuropathy.  7.  Vitamin D deficiency.   PAST SURGICAL HISTORY: 1.  Hysterectomy with bilateral salpingo-oophorectomy.  2.  Laparotomy with colostomy.  3.  Craniotomy.  4.  Appendectomy.  5.  Cholecystectomy.   ALLERGIES: PENICILLIN, DILANTIN, BIAXIN, SULFA, PREDNISONE, AND ADHESIVE TAPE.   HOME MEDICATIONS: Gabapentin 100 mg capsule, 1 capsule 2 times a day; glimepiride 4 mg tablet, 1 tablet orally 2 times a day; Januvia 50 mg tablet, 2 tablets orally once a day; Klor-Con 10 mEq 1 tablet 2 times a day; leg; letrozole 2.5 mg tablet orally, 1 tablet orally once a day; magnesium oxide 400 mg tablet, 1 tablet orally 2 times a day; pantoprazole 40 mg tablet, 1 tablet orally once a day; vitamin D2, 50,000 international units orally, 1 capsule once a week on Wednesdays.   SOCIAL HISTORY: She is widowed, lives alone. No history of smoking, alcohol or illicit drug usage.   FAMILY HISTORY: Father died at the age of 78 years with MI. Mother: History of lung cancer.   REVIEW OF SYSTEMS:  CONSTITUTIONAL: Positive for fever of 101.2 degrees Fahrenheit at home. Positive for chills.  GENERAL: Fatigue with generalized weakness of two days' duration.  EYES: Negative for blurred vision, double vision. No pain. No redness. No inflammation.  EARS, NOSE, AND THROAT: Negative for tinnitus, ear pain, hearing loss, epistaxis, nasal discharge, or difficulty swallowing.  RESPIRATORY: Negative  for cough, wheezing, hemoptysis, dyspnea, painful respirations.  CARDIOVASCULAR: Negative for chest pain, shortness of breath, orthopnea, pedal edema, palpitations, dizziness, or syncope.  GASTROINTESTINAL: Negative for nausea, vomiting, diarrhea, abdominal pain, hematemesis, melena.  GENITOURINARY: Negative  for dysuria, hematuria, frequency, or urgency.  ENDOCRINE: Negative for polyuria, nocturia. No heat or cold intolerance.  HEMATOLOGIC: Negative for easy bruising, bleeding.  INTEGUMENTARY: Negative for acne, skin rash, lesions.  MUSCULOSKELETAL: Negative for any arthritis, joint swelling, or gout.  NEUROLOGICAL: Negative for focal weakness or numbness. No history of CVA, TIA, or seizure disorder.  PSYCHIATRIC: Negative for anxiety, insomnia, or depression.   PHYSICAL EXAMINATION:  VITAL SIGNS: On arrival to the Emergency Room, temperature 101.1 degrees Fahrenheit, pulse rate 94, respirations 18 per minute, blood pressure 114/66, oxygen saturation 95% on room air.  GENERAL: Well-nourished, well-developed lady, alert, awake, and oriented, in no acute distress, pleasant and cooperative, comfortably lying in the bed.  HEAD: Atraumatic, normocephalic.  EYES: Pupils equal and reactive to light and accommodation. No conjunctival pallor. No scleral icterus. Extraocular movements intact.  NOSE: No nasal lesions. No drainage.  EARS: No drainage, no external lesions.  ORAL CAVITY: No mucosal lesions. No exudates. No masses.  NECK: Supple. No JVD. No thyromegaly. No carotid bruit. Range of motion of neck normal.  RESPIRATORY: Good respiratory effort. Bilateral vesicular breath sounds present. No rales or rhonchi were heard.  CARDIOVASCULAR: S1, S2 regular. No murmurs, gallops, or clicks have been observed. Pulses equal at carotid, femoral, and pedal pulses. No peripheral edema. CHEST WALL: A Port-A-Cath on the right side of the chest.  GASTROINTESTINAL: Abdomen soft. Colostomy site clean with a bag in situ with stools present. No tenderness. No hepatosplenomegaly. No guarding. No rigidity. Bowel sounds present and equal in all 4 quadrants.  GENITOURINARY: Deferred.  MUSCULOSKELETAL: Gait not tested. Range of motion adequate in all areas. Motor strength and tone equal bilaterally.  SKIN: Inspection within  normal limits.  LYMPHATIC: No cervical lymphadenopathy.  VASCULAR: Good dorsalis pedis, posterior tibial pulses.  NEUROLOGICAL: Alert, awake, and oriented x 3 present. Cranial nerves II through XII grossly intact. DTR 2+ bilateral and symmetrical in both upper and lower extremities. Motor strength 5/5 in both upper and lower extremities.  PSYCHIATRIC: Judgment and insight are adequate. Alert and oriented x 3. Memory and mood within normal limits.   LABORATORY DATA: Serum glucose 190, BUN 10, creatinine 1.06, sodium 132, potassium 3.4, chloride 96, bicarbonate 28, total calcium 8.1. Lipase 49, total protein 6.2, albumin 2.2, total bilirubin 0.8, alkaline phosphatase 291, AST 30, ALT 43. Troponin less than 0.02. WBC 9.6, hemoglobin 9.1, hematocrit 27.7, and platelet count 249,000. Urinalysis: Hazy color, 1+ blood, leukocyte esterase 1+, WBC 29 per high-power field.   IMAGING STUDIES: Chest x-ray: No active cardiopulmonary disease.   ASSESSMENT AND PLAN: A 80 year old Caucasian female with a past medical history of endometrial cancer, recurrent metastatic carcinoma of the mesentery with liver metastases, history of diabetes mellitus type 2, hypertension, hyperlipidemia, peripheral neuropathy, and vitamin D deficiency, presents to the Emergency Room with the complaints of fever with chills and temperature of 101 degrees Fahrenheit and generalized weakness and decreased oral intake for the past 1 to 2 days. 1.  Urinary tract infection. Plan: Blood cultures and urine cultures obtained. The patient started on intravenous ceftriaxone. Continue intravenous fluids. Tylenol as needed and follow up cultures and modify antibiotic accordingly.  2.  Generalized weakness secondary due to urinary tract infection and poor oral intake. Intravenous hydration. Continue supportive care. Monitor  clinically.  3.  History of endometrial cancer status post surgery and chemotherapy and history of recurrent metastatic mesenteric  carcinoma with liver metastases. The patient is under care of oncology. Stable. Continue care for oncology.  4.  Diabetes mellitus type 2 on oral medications. Stable clinically. Continue home medications and sliding scale insulin. Follow up blood sugars.  5.  Hypertension, controlled on home medications. Continue same.  6.  Hyperlipidemia on statin. Stable. Continue home medications.  7.  Deep vein thrombosis prophylaxis. Subcutaneous Lovenox.  8.  Gastrointestinal prophylaxis. Protonix.   CODE STATUS: Full code.   TIME SPENT: 55 minutes.   ____________________________ Crissie FiguresEdavally N. Ivadell Gaul, MD enr:ST D: 10/11/2014 01:21:39 ET T: 10/11/2014 01:58:33 ET JOB#: [id number]  cc: Crissie FiguresEdavally N. Frances Joynt, MD, <Dictator> Marisue IvanKanhka Linthavong, MD Crissie FiguresEDAVALLY N Airabella Barley MD ELECTRONICALLY SIGNED 10/11/2014 18:55

## 2015-02-20 NOTE — Consult Note (Signed)
Brief Consult Note: Diagnosis: BOWEL OBSTRUCTION, INTRA ABDOMINAL TUMOR WITH LIVER METASTASIS.   Patient was seen by consultant.   Comments: PATIENT SEEN CHART REVIEWED DICTATED NOTE TO FOLLOW.  CT SHOWS WHAT LOOKS LIKE RCURRENT ENDOMETRIAL CANCER, INCLUDING LIVER METASTASIS, THERE IS AN ABNORMAL NARROW SEGMENT OF DISTAL COLON, POSSIBLY A COLON PRIMARY. BOWEL OBSTRUCTION.  PLAN.Marland Kitchen.GI AND SURGERY HAVE SEEN. SIGMOIDOSCOPY PLANNED. LIKELY SURGERY TO RELIEVE BOWEL OBSTRUCTION. IF COLON PRIMARY CANCER FOUND I WOULD STILL BE CONCERNED RE TWO MALIGNANCIES. WOULD CHECK CA 125 AND CEA. I WOULD DISCUSS WITH SURGERY AND GYN ONCOLOGY, RE IF ANY ROLE OF DEBULKING SURGERY EITHER EARLY OR LATER WITH APPARENT LIVER METASTASIS. CHEMOTX LATER AFTER TISSUE OBTAINED AND OBSTRUCTION RELIEVED.  Electronic Signatures: Marin RobertsGittin, Damira Kem G (MD)  (Signed 21-Jan-15 23:53)  Authored: Brief Consult Note   Last Updated: 21-Jan-15 23:53 by Marin RobertsGittin, Anzley Dibbern G (MD)

## 2015-02-20 NOTE — Consult Note (Signed)
Chief Complaint:  Subjective/Chief Complaint seen for possible distal colonic obstruction, abnormal ct sigmoid colon.  doing well overnight, tolerated enemas.  no n/v or abdominal pain.   VITAL SIGNS/ANCILLARY NOTES: **Vital Signs.:   22-Jan-15 09:37  Vital Signs Type Routine  Temperature Temperature (F) 97.3  Celsius 36.2  Pulse Pulse 78  Respirations Respirations 20  Systolic BP Systolic BP 462  Diastolic BP (mmHg) Diastolic BP (mmHg) 69  Mean BP 84  BP Source  if not from Vital Sign Device non-invasive  Pulse Ox % Pulse Ox % 95  Pulse Ox Activity Level  At rest  Oxygen Delivery Room Air/ 21 %   Brief Assessment:  Cardiac Regular   Respiratory clear BS   Gastrointestinal details normal Soft  Nontender  Nondistended  No masses palpable  Bowel sounds normal   Lab Results: Routine Chem:  22-Jan-15 05:08   Magnesium, Serum 1.8 (1.8-2.4 THERAPEUTIC RANGE: 4-7 mg/dL TOXIC: > 10 mg/dL  -----------------------)  Glucose, Serum  56  BUN 10  Creatinine (comp) 0.62  Sodium, Serum 136  Potassium, Serum 3.6  Chloride, Serum 103  CO2, Serum 26  Calcium (Total), Serum 8.6  Anion Gap 7  Osmolality (calc) 269  eGFR (African American) >60  eGFR (Non-African American) >60 (eGFR values <53m/min/1.73 m2 may be an indication of chronic kidney disease (CKD). Calculated eGFR is useful in patients with stable renal function. The eGFR calculation will not be reliable in acutely ill patients when serum creatinine is changing rapidly. It is not useful in  patients on dialysis. The eGFR calculation may not be applicable to patients at the low and high extremes of body sizes, pregnant women, and vegetarians.)  Routine Hem:  22-Jan-15 05:08   WBC (CBC) 4.5  RBC (CBC) 4.30  Hemoglobin (CBC) 12.7  Hematocrit (CBC) 37.6  Platelet Count (CBC) 173  MCV 88  MCH 29.5  MCHC 33.7  RDW 13.8  Neutrophil % 67.3  Lymphocyte % 18.5  Monocyte % 13.3  Eosinophil % 0.5  Basophil % 0.4   Neutrophil # 3.0  Lymphocyte #  0.8  Monocyte # 0.6  Eosinophil # 0.0  Basophil # 0.0 (Result(s) reported on 20 Nov 2013 at 06:46AM.)   Radiology Results: CT:    21-Jan-15 03:43, CT Abdomen and Pelvis With Contrast  CT Abdomen and Pelvis With Contrast   REASON FOR EXAM:    (1) generalized abdominal pain vomiting; (2)   generalized abdominal pain vomiting  COMMENTS:       PROCEDURE: CT  - CT ABDOMEN / PELVIS  W  - Nov 19 2013  3:43AM     CLINICAL DATA:  Dehydration, continued nausea and vomiting with  abdominal pain. History of endometrial cancer, appendectomy,  cholecystectomy.    EXAM:  CT ABDOMEN AND PELVIS WITH CONTRAST    TECHNIQUE:  Multidetector CT imaging of the abdomen and pelvis was performed  using the standard protocol following bolusadministration of  intravenous contrast.    CONTRAST:  125 cc of Isovue 370    COMPARISON:  Abdominal radiograph November 16, 2013 and CT of the  chest, abdomen and pelvis January 29, 2012    FINDINGS:  Limited view of the lung bases are clear. Visualized heart and  pericardium are unremarkable.    Very small hiatal hernia. Stomach is mildly distended with contrast  and otherwise unremarkable. There is an approximate 7 cm segment of  distal colon wall thickening. No pericolonic inflammatory changes.  Above this level, the colon is distended  with gas and stool. Small  bowel is normal in course and caliber. Very mild thickened  appearance of the terminal ileum. Within the left pelvis, contiguous  with the colonic wall thickening is a sub cm nodule.    Ill-defined hypodense 28 x 30 mm mass in the right lobe of the  liver, segment 5, axial 21/93. A nonspecific 8 mm hypodensity in the  right lobe of the liver, segment 7 is too small to characterize. In  addition, hypodense ill-defined mass in the splenic hilum measures  20 x 21 mm. An an ill-defined mass within the spleen measures 22 x  23 mm.    Status post cholecystectomy.  Atrophic pancreas, adrenal glands are  unremarkable.  Too small to characterize hypodensity in the kidneys bilaterally,  without nephrolithiasis, hydronephrosis. Prompt symmetric excretion  of contrast into the urinary collecting system on delayed imaging.  Abdominal aorta is overall normal course and caliber with mild  calcific atherosclerosis. Urinary bladder is partially distended and  otherwise unremarkable. Status post hysterectomy.    Laparotomy scar again seen, however there is a new 12 x 20 mm  hypodense ill-defined nodules within the anterior abdominal wall  underlying the scar. Ill-defined 20 x 15 mm mid mesenteric nodule,  axial 47/93. Necrotic 17 x 25 mm. External iliac chain nodal mass,  axial 71/93. Osteopenia without destructive bony lesions, sclerotic  focus in vertebral body L3 was present previously.     IMPRESSION:  7 cm segment of circumferential distal colon wall thickening,  without inflammatory changes resulting in distal colonic bowel  obstruction. Given the additional findings, this is concerning for  primary colonic neoplasm.    Ill-defined masses within the liver and spleen, as well as mass  within a splenic hilum splenule most consistent with metastatic  disease. New mesentery/omental masses concerning for metastatic  disease, in addition to a necrotic right external iliac chain nodal  conglomeration.    Findings discussed with and reconfirmed by Dr. Owens Shark on November 19, 2013 at approximate 0350 hr.    Electronically Signed    By: Elon Alas    On: 11/19/2013 03:59         Verified By: Ricky Ala, M.D.,   Assessment/Plan:  Assessment/Plan:  Assessment 1) abnormal CT, possible distal colonic mass with obstruction, in the setting of h/o uterine cancer.   Plan 1) sedated flexible sigmoidoscopy today.  I have discussed the risks benefits and complications of proceedure to include not limited to bleeding infection perforation and  sedation and shw wishes to proceed.  further recs to follow.   Electronic Signatures: Loistine Simas (MD)  (Signed 22-Jan-15 10:44)  Authored: Chief Complaint, VITAL SIGNS/ANCILLARY NOTES, Brief Assessment, Lab Results, Radiology Results, Assessment/Plan   Last Updated: 22-Jan-15 10:44 by Loistine Simas (MD)

## 2015-02-20 NOTE — Consult Note (Signed)
PATIENT NAME:  [name], [name] MR#:  [id number] DATE OF BIRTH:  02-02-1935  DATE OF CONSULTATION:  11/19/2013  CONSULTING PHYSICIAN:  Knute Neuobert G. Lorre NickGittin, MD    sSEE REDICTATION DUE TO MULTIPLE INAUDIBLE OR TECHNICAL ERRORS  REPORT OF CONSULTATION: [name] is a 80 year old patient who was admitted on January ..............Marland Kitchen.with signs and symptoms of intestinal obstruction. She has a history of endometrial cancer, prior followed by Dr.......... and by Dr. Wendy PoetBrigitte Miller........... adenocarcinoma stage IIIc grade 1, and underwent a TAH/BSO procedure back in March 2011. Had carboplatin and paclitaxel x 3 treatments, and radiation treatment and additional 3 treatments with ..............Marland Kitchen.back in February............Marland Kitchen.Marland Kitchen.The last oncology follow up was in November.   The patient, who is followed by Surgery, has had a CT scan that appears to show recurrent cancer............... including liver metastasis. There is also an abnormal narrow segment of distal colon, possibly a colon primary tumor, so that GI evaluation including a sigmoidoscopy ...........Marland Kitchen. bowel obstruction is planned. I have discussed with Surgery and GI. The patient was seen and evaluated January 21, although ............... was added at a later time.   PAST MEDICAL HISTORY: Hypertension,................ 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 Vitamin D deficiency, appendectomy, cholecystectomy, also prior hysterectomy back in 2004, prior hysterectomy in 2011, as well as............Marland Kitchen.   MEDICATIONS AT TIME OF ADMISSION:  Gabapentin, calcium, Tramadol ................. glyburide and lovastatin.  ALLERGIES:  BIAXIN, DILANTIN, PENICILLIN, SULFA, and ADHESIVE.   SOCIAL HISTORY: No alcohol or tobacco.   FAMILY HISTORY: There is lung cancer in the family as well as ........Marland Kitchen. otherwise noncontributory.   REVIEW OF SYSTEMS: When I initially saw the patient, she  looked comfortable but was having symptoms of bowel obstruction. Was not vomiting at that time, was comfortable. Had not had a bowel movement. There was some general weakness. The patient has had some weight loss. There is no headache or visual disturbance. No dizziness. No ear or jaw pain. There is some chronic hearing loss. No cough, wheezing, shortness of breath or hemoptysis. No chest pain or palpitation. No extremity edema. No dysuria or hematuria. No easy bleeding or bruising. No rashes. No hot or cold intolerance.   PHYSICAL EXAMINATION: VITAL SIGNS:  Stable. GENERAL: There is some pallor.  NECK:  No palpable lymph nodes ..........Marland Kitchen.  LUNGS: Clear. No wheezing or rales.  HEART: Regular.  ABDOMEN: Not tender.  EXTREMITIES: There was no edema.  NEUROLOGIC: Grossly nonfocal.  MUSCULOSKELETAL:  No swollen or deformed joints. SKIN:  No rashes skin lesion.   LABS: On admission, creatinine was 0.78. Liver chemistries were normal. White count ...............  hemoglobin 14.9, platelets were 220. CT scan of abdomen and pelvis showed distal colon wall thickening and obstruction. ............ hilum, mesentery and omentum, and within the liver, consistent with intra-abdominal and liver metastatic disease.   IMPRESSION AND PLAN: Bowel obstruction. Likely diagnosis was recurrent endometrial cancer. There is some suggestion of possibly a primary colonic malignancy. Colonoscopy or sigmoidoscopy was planned. I recommended that we document the tumor markers, CA125 ............... Surgery will follow. It is apparent that patient is going to need diverting surgery. Later, after tissue diagnosis determination of treatment, if confirmed GYN cancer, .........Marland Kitchen. platinum-based therapy, as patient obviously is platinum sensitive, with a long disease-free interval. Additional .........Marland Kitchen. measures will be taken if there is a primary colon which, in fact, was not later demonstrated. After surgical appropriate followup,   port to be placed and then chemotherapy in the Cancer  Center within 10-day to 2-week interval is planned.    ____________________________ Knute Neu. Lorre Nick, MD rgg:mr D: 12/07/2013 14:09:00 ET T: 12/07/2013 18:32:21 ET JOB#: [id number]  cc: Knute Neu. Lorre Nick, MD, <Dictator> Marin Roberts MD ELECTRONICALLY SIGNED 12/30/2013 14:25

## 2015-02-20 NOTE — Op Note (Signed)
PATIENT NAME:  [name], [name] MR#:  [id number] DATE OF BIRTH:  1935-03-13  DATE OF PROCEDURE:  12/09/2013  PREOPERATIVE DIAGNOSIS: Recurrent stage IV endometrial carcinoma.   POSTOPERATIVE DIAGNOSIS: Recurrent stage IV endometrial carcinoma.   PROCEDURE PERFORMED:  1.  Right subclavian vein Port-A-Cath insertion.  2.  Intraoperative use of ultrasonography.  3.  Intraoperative use of fluoroscopy.   SURGEON: Natale LayMark Kandon Hosking, M.D.   ASSISTANT: None.   ANESTHESIA: Local with monitored anesthesia care.   DESCRIPTION OF PROCEDURE: With informed consent, supine position, a shoulder roll between her scapulas, the patient's right neck and chest were sterilely prepped and draped with ChloraPrep followed by Ioban drape. Timeout was observed. Sterilely prepped handheld probe was then used to interrogate the course of the right internal jugular vein. The patient had a previous Port-A-Cath on this side. The vein appeared to be small and compressible. Under direct visualization, the vein was cannulated on first pass with a 22-gauge finder needle. However, multiple passes with the larger bore needle demonstrated return of venous blood; however, wire could not be passed despite multiple attempts.   Pressure was held for 10 minutes. Attention was then turned to the subclavian vein approach. Local anesthesia was infiltrated in the infraclavicular notch on the right side. On first pass, the needle was placed directly into the vein. There was brisk return of venous-appearing blood. Wire was unable to be passed after 2 attempts. On the third attempt and repositioning of the needle, I was able to pass the wire and utilizing fluoroscopy demonstrated it to be in the proper location.   The needle was removed. A subcutaneous pocket was then fashioned on the previous site on the anterior chest wall. This was accomplished utilizing electrocautery through a vascular scar.   A small incision was made over the wire with a #11  blade. The tubing was then tunneled between these 2 sites.   The tubing was then inserted into the vein over a peel-away introducer and dilator utilizing fluoroscopic guidance. Tubing was then shortened under fluoroscopic guidance to the SVC right atrial junction. Tubing was then assembled to the reservoir externally. The tubing flushed and aspirated without difficulty. Heparinized saline 3 mL of 1000 units/mL was instilled into the system. The reservoir was then inserted into the pocket and secured to the clavipectoral fascia at 2 points with 3-0 PDS suture. Deep layer of the pocket was obliterated utilizing a running 3-0 Vicryl suture. 4-0 Vicryl subcuticular in the skin. Several interrupted simple 4-0 nylon were placed for hemostatic purposes. Dermabond was applied. A small skin incision in the infraclavicular notch was then closed with 4-0 Vicryl suture and Dermabond. Sterile dressings were then applied.   The patient was then taken to the recovery room in stable condition where chest x-ray will be obtained.   ____________________________ Redge GainerMark A. Egbert GaribaldiBird, MD mab:aw D: 12/10/2013 09:04:31 ET T: 12/10/2013 09:19:13 ET JOB#: [id number]  cc: Loraine LericheMark A. Egbert GaribaldiBird, MD, <Dictator> Knute Neuobert G. Lorre NickGittin, MD Raynald KempMARK A Jaki Steptoe MD ELECTRONICALLY SIGNED 12/12/2013 16:45

## 2015-02-20 NOTE — Consult Note (Signed)
PATIENT NAME:  [name], [name] DATE OF BIRTH:  [birth year]  DATE OF CONSULTATION:  11/19/2013  REASON FOR CONSULTATION:  Large bowel obstruction.   HISTORY OF PRESENT ILLNESS: This is a 80 year old white female with a history of stage III endometrial carcinoma treated by total abdominal hysterectomy with bilateral salpingo-oophorectomy and pelvic lymphadenectomy back in April 2011. She was treated with chemotherapy followed by external beam radiation. She had a decline in her CA-125. The patient was followed after that by the Cancer Center very carefully, as well as Dr. Hyacinth MeekerMiller. She had a rising CA-125 with the November 2014 CA-12 was 66.1. Flexible colonoscope was performed in September 2013 which demonstrated some narrowing of the sigmoid colon. The patient was then admitted to the medical service the day of consultation with a several day history of worsening abdominal pain. Over the last several weeks, she has had significant abdominal cramping with audible bowel sounds, some nausea, vomiting and diminishment in size of the caliber of her stool. Workup in the Emergency Room demonstrated a CT scan with IV contrast demonstrating a 7 cm segment of distal circumferential sigmoid colon process with resultant proximal large bowel dilatation. There is also evidence of ill-defined lesion seen within the liver, spleen and mesentery. She was admitted to the medical service. GI medicine, myself and oncology have been consulted.  PAST MEDICAL AND SURGICAL HISTORY:  Well described in the chart, including an appendectomy, cholecystectomy, history of craniotomy and hysterectomy as described above.   PHYSICAL EXAMINATION: GENERAL: The patient is in no obvious distress.  ABDOMEN: Soft and minimally distended. There is active bowel sounds. There is a well-healed lower midline incision.   LABORATORY DATA: Values were reviewed. CT scan is as described above.   IMPRESSION: Distal large bowel  obstruction, malignant, secondary to recurrent locally advanced and possibly metastatic uterine malignancy.   PLAN: I agree with flexible sigmoidoscopy, oncology evaluation and repeating her CA-125. I will contact Dr. Hyacinth MeekerMiller regarding feasibility of any type of further interventions from an oncological standpoint which I suspect will not be the case. The patient will then benefit from either a colonic stent and initiation of chemotherapy versus open colostomy, biopsy followed by chemotherapy. I discussed this case with Dr. Lorre NickGittin, as well as with Dr. Marva PandaSkulskie.  TOTAL TIME SPENT:  45 minutes     ____________________________ Redge GainerMark A. Egbert GaribaldiBird, MD mab:ce D: 11/20/2013 17:46:57 ET T: 11/20/2013 17:53:00 ET JOB#: [id number]  cc: Loraine LericheMark A. Egbert GaribaldiBird, MD, <Dictator> Raynald KempMARK A Tabia Landowski MD ELECTRONICALLY SIGNED 11/25/2013 8:36

## 2015-02-28 NOTE — Consult Note (Signed)
Chief Complaint:  Subjective/Chief Complaint Pt denies abdominal pain.  Denies nausea or vomiting.   VITAL SIGNS/ANCILLARY NOTES: **Vital Signs.:   21-Jan-16 07:30  Temperature Temperature (F) 98.3  Celsius 36.8  Pulse Pulse 83  Respirations Respirations 19  Systolic BP Systolic BP 295  Diastolic BP (mmHg) Diastolic BP (mmHg) 55  Mean BP 74  Pulse Ox % Pulse Ox % 100  Pulse Ox Activity Level  At rest  Oxygen Delivery Room Air/ 21 %   Brief Assessment:  GEN no acute distress, thin, A/Ox3   Cardiac Regular   Respiratory normal resp effort   Gastrointestinal Normal   Gastrointestinal details normal Soft  Nontender  Nondistended  No masses palpable  Bowel sounds normal   EXTR negative cyanosis/clubbing, negative edema   Additional Physical Exam Skin: pale, warm, dry, no jaundice   Lab Results:  Hepatic:  21-Jan-16 03:53   Bilirubin, Direct  2.2 (Result(s) reported on 19 Nov 2014 at 04:11AM.)  Bilirubin, Total  2.6  Alkaline Phosphatase  427 (46-116 NOTE: New Reference Range 05/19/14)  SGPT (ALT)  87 (14-63 NOTE: New Reference Range 05/19/14)  SGOT (AST)  79  Total Protein, Serum  5.1  Albumin, Serum  1.6  Routine Chem:  21-Jan-16 03:53   Lipase  26 (Result(s) reported on 19 Nov 2014 at 04:22AM.)  Glucose, Serum  129  BUN 8  Creatinine (comp) 0.83  Sodium, Serum 138  Potassium, Serum  3.3  Chloride, Serum 105  CO2, Serum 25  Calcium (Total), Serum  7.7  Osmolality (calc) 276  eGFR (African American) >60  eGFR (Non-African American) >60 (eGFR values <36m/min/1.73 m2 may be an indication of chronic kidney disease (CKD). Calculated eGFR, using the MRDR Study equation, is useful in  patients with stable renal function. The eGFR calculation will not be reliable in acutely ill patients when serum creatinine is changing rapidly. It is not useful in patients on dialysis. The eGFR calculation may not be applicable to patients at the low and high extremes of body  sizes, pregnant women, and vegetarians.)  Result Comment labs - This specimen was collected through an   - indwelling catheter or arterial line.  - A minimum of 521m of blood was wasted prior    - to collecting the sample.  Interpret  - results with caution.  Result(s) reported on 19 Nov 2014 at 04:22AM.  Anion Gap 8  Routine Hem:  21-Jan-16 03:53   WBC (CBC) 9.0  RBC (CBC)  2.89  Hemoglobin (CBC)  7.7  Hematocrit (CBC)  24.4  Platelet Count (CBC) 233  MCV 84  MCH 26.8  MCHC  31.8  RDW  18.0  Neutrophil % 87.6  Lymphocyte % 4.9  Monocyte % 6.8  Eosinophil % 0.4  Basophil % 0.3  Neutrophil #  7.9  Lymphocyte #  0.4  Monocyte # 0.6  Eosinophil # 0.0  Basophil # 0.0 (Result(s) reported on 19 Nov 2014 at 04:11AM.)   Assessment/Plan:  Assessment/Plan:  Assessment Cholangitis: Much improved s/p biliary stent change Anemia of chronic disease: Hgb stable   Plan 1) continue supportive measures 2) stent change in 2 months 3) continue 10-day course of antibiotics,-Appreciate ID input 4) Pt advised to call if any abdominal pain, jaundice, pruritis or fever once discharged home. Pt agrees. Please call if you have any questions or concerns   Electronic Signatures: JoAndria MeuseNP)  (Signed 21-Jan-16 10:52)  Authored: Chief Complaint, VITAL SIGNS/ANCILLARY NOTES, Brief Assessment, Lab Results, Assessment/Plan  Last Updated: 21-Jan-16 10:52 by Andria Meuse (NP)

## 2015-02-28 NOTE — Consult Note (Signed)
Chief Complaint:  Subjective/Chief Complaint Pt denies abdominal pain.  c/o nausea, denies vomiting.  Good ostomy output.   VITAL SIGNS/ANCILLARY NOTES: **Vital Signs.:   27-Jan-16 12:46  Vital Signs Type Routine  Temperature Temperature (F) 99.3  Celsius 37.3  Temperature Source oral  Pulse Pulse 100  Respirations Respirations 20  Systolic BP Systolic BP 546  Diastolic BP (mmHg) Diastolic BP (mmHg) 73  Mean BP 98  Pulse Ox % Pulse Ox % 94  Pulse Ox Activity Level  At rest  Oxygen Delivery Room Air/ 21 %   Brief Assessment:  GEN no acute distress, thin, A/Ox3, family at bedside   Cardiac Regular   Respiratory normal resp effort   Gastrointestinal Normal   Gastrointestinal details normal Soft  Nontender  Nondistended  No masses palpable  Bowel sounds normal   EXTR negative cyanosis/clubbing, negative edema   Additional Physical Exam Skin: pale, warm, dry, mild jaundice HEENT: +scleral icterus   Lab Results: Hepatic:  27-Jan-16 04:44   Bilirubin, Total  6.4  Alkaline Phosphatase  677  SGPT (ALT) 50  SGOT (AST)  80  Total Protein, Serum  5.3  Albumin, Serum  1.6  Routine Chem:  27-Jan-16 04:44   Glucose, Serum  144  BUN  5  Creatinine (comp) 0.69  Sodium, Serum 136  Potassium, Serum  3.3  Chloride, Serum 99  CO2, Serum 31  Calcium (Total), Serum 8.7  Osmolality (calc) 272  eGFR (African American) >60  eGFR (Non-African American) >60 (eGFR values <20m/min/1.73 m2 may be an indication of chronic kidney disease (CKD). Calculated eGFR, using the MRDR Study equation, is useful in  patients with stable renal function. The eGFR calculation will not be reliable in acutely ill patients when serum creatinine is changing rapidly. It is not useful in patients on dialysis. The eGFR calculation may not be applicable to patients at the low and high extremes of body sizes, pregnant women, and vegetarians.)  Anion Gap  6  Result Comment LABS - This specimen was  collected through an   - indwelling catheter or arterial line.  - A minimum of 593m of blood was wasted prior    - to collecting the sample.  Interpret  - results with caution.  Result(s) reported on 25 Nov 2014 at 05:55AM.  Routine Hem:  27-Jan-16 04:44   WBC (CBC) 8.8  RBC (CBC)  3.29  Hemoglobin (CBC)  9.1  Hematocrit (CBC)  27.5  Platelet Count (CBC) 257  MCV 83  MCH 27.6  MCHC 33.1  RDW  19.2  Bands 1  Segmented Neutrophils 83  Lymphocytes 10  Monocytes 6  Diff Comment 1 ANISOCYTOSIS  Diff Comment 2 POIKILOCYTOSIS  Diff Comment 3 TARGET CELLS  Diff Comment 4 PLTS VARIED IN SIZE  Diff Comment 5 POLYCHROMASIA  Result(s) reported on 25 Nov 2014 at 05:55AM.   Assessment/Plan:  Assessment/Plan:  Assessment Obstructive jaundice secondary to stricture & metastatic disease:  s/p metal stents placed yesterday by Dr WoAllen Norris LFTS are increasing & may be secondary to metastatic disease & advancing hepatic failure versus biliary obstruction alone. Anemia of chronic disease: Hgb stable   Plan 1) continue supportive measures 2) LFTS & INR in AM 3) Consider inpatient oncology visit Please call if you have any questions or concerns   Electronic Signatures: JoAndria MeuseNP)  (Signed 27-Jan-16 13:07)  Authored: Chief Complaint, VITAL SIGNS/ANCILLARY NOTES, Brief Assessment, Lab Results, Assessment/Plan   Last Updated: 27-Jan-16 13:07 by JoAndria MeuseNP)

## 2015-02-28 NOTE — Consult Note (Signed)
PATIENT NAME:  [name], [name] MR#:  [id number] DATE OF BIRTH:  September 19, 1935  DATE OF CONSULTATION:  11/18/2014  REFERRING PHYSICIAN:  Vipul S. Sherryll BurgerShah, MD  CONSULTING PHYSICIAN:  Stann Mainlandavid P. Sampson GoonFitzgerald, MD  REASON FOR CONSULTATION: Sepsis and ascending cholangitis.   HISTORY OF PRESENT ILLNESS: This is a very pleasant 80 year old female with history of endometrial cancer with metastases to the liver, status post surgery and chemotherapy, who was admitted January 19th with several days of fevers and chills. She was found to have elevated LFTs and sepsis on admission. CT imaging showed ascending cholangitis. She underwent an ERCP this morning, on January 20th, with removal of a prior stent that was placed and findings of purulence. We are consulted for further antibiotic management.   PAST MEDICAL HISTORY: 1. Endometrial cancer with metastases to the mesentery and to the liver.  2. Hyperlipidemia.  3. Diabetes.  4. Hypertension.  5. Peripheral neuropathy.  6. Vitamin D deficiency.   PAST SURGICAL HISTORY: Hysterectomy, colostomy, craniotomy, appendectomy, cholecystectomy.   SOCIAL HISTORY: She is widowed, lives alone. No tobacco, alcohol, or drugs.   FAMILY HISTORY: Noncontributory.   ALLERGIES: PENICILLIN, DILANTIN, BIAXIN, SULFA, AND PREDNISONE.   REVIEW OF SYSTEMS: Eleven systems reviewed and negative except as per HPI.   ANTIBIOTICS SINCE ADMISSION: Include vancomycin, meropenem, and levofloxacin.   PHYSICAL EXAMINATION: VITAL SIGNS: Temperature 98.1, pulse 73, blood pressure 113/70, respirations 22, saturations 100% on room air.  GENERAL: She is pleasant, interactive, in no acute distress.  HEENT: Pupils equal, round, reactive to light and accommodation. Sclerae anicteric. Oropharynx clear.  NECK: Supple.  HEART: Regular.  LUNGS: Clear.  ABDOMEN: Soft, nontender. No right upper quadrant tenderness. Colostomy appears within normal limits.  EXTREMITIES: No clubbing, cyanosis, or  edema.  NEUROLOGIC: She is alert and oriented x 3. Grossly nonfocal neuro exam.   LABORATORY DATA: White blood count on admission 11.3, currently 7.5; hemoglobin 7.7, platelets 215. Blood cultures, January 19th: 2 of 2 negative. Urinalysis and urine culture: Negative. Influenza PCR: Negative. Renal function shows a creatinine of normal at 0.99. LFTs on admission showed a total bilirubin of 3.3, now down to 2.2; alkaline phosphatase 572, now 422; AST of 208, now 119; ALT 163, down to 117. Troponin was normal.   IMAGING STUDIES:  Chest x-ray shows no active disease.   IMPRESSION: A 80 year old female with a history of endometrial cancer, admitted with fevers, elevated white count, sepsis, increased LFTs, found to have ascending cholangitis due to an occluded stent. On January 20th, she underwent removal of the stent. She is currently clinically improving. Currently on vancomycin, meropenem, and levofloxacin.   RECOMMENDATIONS: 1. She is PENICILLIN allergic, as well as allergic to SULFA, and BIAXIN. Likely can be treated with ciprofloxacin and Flagyl. Recommend a 10-day course from the time of stent removal and resolution of her occlusion.  2. Follow blood cultures and if other pathogens occur, may need to adjust antibiotics.  3. Thank you for the consult. I will be glad to follow with you    ____________________________ Stann Mainlandavid P. Sampson GoonFitzgerald, MD dpf:mw D: 11/18/2014 14:05:45 ET T: 11/18/2014 14:42:49 ET JOB#: [id number]  cc: Stann Mainlandavid P. Sampson GoonFitzgerald, MD, <Dictator> Roneisha Stern Sampson GoonFITZGERALD MD ELECTRONICALLY SIGNED 11/25/2014 21:02

## 2015-02-28 NOTE — H&P (Signed)
PATIENT NAME:  [name], GRIEDER MR#:  [id number] DATE OF BIRTH:  [birth year]  DATE OF ADMISSION:  11/17/2014  PRIMARY CARE PHYSICIAN:  Dr. Marisue Ivan.    REQUESTING PHYSICIAN:  Dr. Glennie Isle.    CHIEF COMPLAINT:  Fever and chills.   HISTORY OF PRESENT ILLNESS: The patient is a 80 year old female with a known history of endometrial cancer with metastatic disease to liver, she is status post colostomy, diabetes, hypertension, is being admitted for suspected sepsis.  The patient was doing well until yesterday when she started having high-grade fever with chills. She reports that she has not had any chemotherapy in the last couple of months, but lately the last couple days she has been feeling some weakness with nausea and yesterday started having high-grade fever. Came down to the Emergency Department as she was feeling more and more weak, also had some dry cough.  While in the ED she was noted to have fever of 101.6. She was tachycardic up to 130 per minute and is being admitted for further evaluation and management.   PAST MEDICAL HISTORY:  1. Endometrial cancer, status post surgery and chemotherapy.   2. 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  3. Hyperlipidemia.  4. Type 2 diabetes.  5. Hypertension.  6. Peripheral neuropathy.  7. Vitamin D deficiency.   PAST SURGICAL HISTORY:  1. Hysterectomy with bilateral salpingo-oophorectomy.  2. Laparotomy with colostomy.  3. Craniotomy.  4. Appendectomy.  5. Cholecystectomy.    ALLERGIES:  TO PENICILLIN, DILANTIN, BIAXIN, SULFA, PREDNISONE.  SOCIAL HISTORY: She is widowed, lives alone. No history of smoking, alcohol, or illicit drug use. Her daughter lives nearby who helps her out.   FAMILY HISTORY: Father died at age of 8 due to MI. Mother with history of lung cancer.   MEDICATIONS AT HOME:   1. Gabapentin 100 mg p.o. b.i.d.  2. Glimepiride 4 p.o. b.i.d.  3. Januvia 100 mg p.o.  daily.  4. Klor-Con 10 mEq p.o. b.i.d.  5. Letrozole 2.5 mg p.o. daily.  6. Magnesium oxide 400 mg p.o. 3 times a day.  7. Protonix 40 mg p.o. daily.   8. Vitamin D 50,000 international units once a week on Wednesday.   REVIEW OF SYSTEMS:    CONSTITUTIONAL: Positive for fever, fatigue, weakness.  EYES: No blurred or double vision.  ENT: No tinnitus or ear pain.  RESPIRATORY: Dry cough. No wheezing or hemoptysis.  CARDIOVASCULAR: No chest pain, orthopnea, edema.  GASTROINTESTINAL: Positive for nausea and vomiting and diarrhea.  GENITOURINARY: No dysuria or hematuria.  ENDOCRINE: No polyuria, nocturia.   HEMATOLOGY: No anemia or easy bruising.  SKIN: No rash or lesion.  MUSCULOSKELETAL: Positive for arthritis and muscle cramp.  NEUROLOGIC: No tingling, numbness. Positive for generalized weakness.  PSYCHIATRIC:  No history of anxiety or depression.    PHYSICAL EXAMINATION:  VITAL SIGNS: Temperature 101.6, heart rate 130 per minute, respirations 18 per minute, blood pressure 141/66. She is saturating 98% on room air.   GENERAL:  The patient is a 80 year old female lying in the bed, in no distress.  EYES: Pupils equal, round, reactive to light and accommodation. No scleral icterus. Extraocular muscles intact.   HEENT:  Head atraumatic, normocephalic.  Oropharynx and nasopharynx clear.   NECK:  Supple. No jugular venous distention. No thyroid enlargement or tenderness. LUNGS: Clear to auscultation bilaterally. No wheezing, rales, rhonchi, crepitation.  CARDIOVASCULAR: S1, S2 normal. No murmurs, rubs, gallops.   ABDOMEN: Soft, nontender, nondistended. Bowel sounds  present. No organomegaly or masses. Colostomy site looks clean with bag in situ, stool present, seemed to be functioning fine. No signs of infection around.   GENITOURINARY: Deferred.  MUSCULOSKELETAL: No joint effusion or tenderness.  NEUROLOGIC:  Cranial nerves II through XII intact.  Muscle strength 5/5 in extremities.  Sensation intact.  PSYCHIATRIC: The patient is alert and oriented x 3.  SKIN: No obvious rash, lesion, ulcer. She does have a port in her right upper chest which seemed to be working fine. No signs of infection. No tenderness noted.   DIAGNOSTIC DATA: Laboratory panel, sodium 134, potassium 3.4, chloride 96, CO2 of 28, BUN 7, creatinine 1.08, blood sugar 194. Serum magnesium 1.4, phosphorus 1.6. Troponin was less than 0.02. CBC showed white count of 11.3, hemoglobin 9.0, hematocrit 27.8, platelet 281,000.  Normal coagulation panel. LFTs showed total bilirubin of 3.3, albumin 2.3, alkaline phosphatase 572, AST 208, ALT 163. UA negative. Lactic acid 1.6.   Chest x-ray in the Emergency Department showed no acute cardiopulmonary disease.   IMPRESSION AND PLAN:  1.  Sepsis of unknown source.  The biggest possibility is likely biliary stents which probably are infected at this point, her LFTs are through the roof.  She was scheduled to have those stents out anyway in 3 months and she missed the deadline which is upcoming with Dr. Servando SnareWohl.  At this point will cover her with broad-spectrum antibiotics.  I have discussed the case with Dr. Lynnae Prudeobert Elliott who is going to discuss the case with Dr. Bluford Kaufmannh tomorrow to see if stents can be removed. We will monitor her LFTs closely, avoid any hepatotoxic medications.  2.  Elevated liver function tests, again concerning for biliary stents infection.  We will cover her with broad-spectrum antibiotic, obtain 2 sets of blood cultures, sputum and urine culture will also be obtained, consult GI.  We will also consult infectious disease.  3.  Hypokalemia/hypomagnesemia/hypophosphatemia. We will replete and recheck.  Will placed on CCU electrolyte protocol, discuss with pharmacist.  4.  Diabetes mellitus. We will put her on sliding scale insulin, hold her metformin at this time.   CODE STATUS: Full code.   TOTAL TIME TAKING CARE OF THIS PATIENT (CRITICAL CARE):  55 minutes.      ____________________________ Ellamae SiaVipul S. Sherryll BurgerShah, MD vss:bu D: 11/17/2014 19:41:59 ET T: 11/17/2014 20:03:30 ET JOB#: [id number]  cc: Chemeka Filice S. Sherryll BurgerShah, MD, <Dictator> Marisue IvanKanhka Linthavong, MD Ellamae SiaVIPUL S [facility] [place] MD ELECTRONICALLY SIGNED 11/19/2014 10:15

## 2015-02-28 NOTE — Consult Note (Signed)
Chief Complaint:  Subjective/Chief Complaint Pt denies abdominal pain.  Denies nausea or vomiting.   VITAL SIGNS/ANCILLARY NOTES: **Vital Signs.:   20-Jan-16 07:00  Vital Signs Type Routine  Temperature Temperature (F) 97.5  Celsius 36.3  Temperature Source oral  Pulse Pulse 73  Respirations Respirations 23  Systolic BP Systolic BP 94  Diastolic BP (mmHg) Diastolic BP (mmHg) 49  Mean BP 64  Pulse Ox % Pulse Ox % 100  Pulse Ox Activity Level  At rest  Oxygen Delivery Room Air/ 21 %    10:31  Vital Signs Type Post-Procedure  Pulse Pulse 74  Respirations Respirations 19  Systolic BP Systolic BP 370  Diastolic BP (mmHg) Diastolic BP (mmHg) 55  Mean BP 71  Pulse Ox % Pulse Ox % 100  Pulse Ox Activity Level  At rest  Oxygen Delivery Room Air/ 21 %   Brief Assessment:  GEN no acute distress, thin, A/Ox3   Cardiac Regular   Respiratory normal resp effort   Gastrointestinal Normal   Gastrointestinal details normal Soft  Nontender  Nondistended  No masses palpable  Bowel sounds normal   EXTR negative cyanosis/clubbing, negative edema   Additional Physical Exam Skin: pale, warm, dry   Lab Results: Thyroid:  20-Jan-16 04:03   Thyroid Stimulating Hormone 0.711 (0.45-4.50 (IU = International Unit)  ----------------------- Pregnant patients have  different reference  ranges for TSH:  - - - - - - - - - -  Pregnant, first trimetser:  0.36 - 2.50 uIU/mL)  Hepatic:  20-Jan-16 04:03   Bilirubin, Total  2.8  Alkaline Phosphatase  442 (46-116 NOTE: New Reference Range 05/19/14)  SGPT (ALT)  117 (14-63 NOTE: New Reference Range 05/19/14)  SGOT (AST)  119  Total Protein, Serum  5.2  Albumin, Serum  1.7  Routine Chem:  20-Jan-16 04:03   BUN 9  Creatinine (comp) 0.99  Sodium, Serum 139  Potassium, Serum  3.4  Chloride, Serum 105  CO2, Serum 24  Calcium (Total), Serum  8.0  Osmolality (calc) 280  eGFR (African American) >60  eGFR (Non-African American)  58 (eGFR  values <22m/min/1.73 m2 may be an indication of chronic kidney disease (CKD). Calculated eGFR, using the MRDR Study equation, is useful in  patients with stable renal function. The eGFR calculation will not be reliable in acutely ill patients when serum creatinine is changing rapidly. It is not useful in patients on dialysis. The eGFR calculation may not be applicable to patients at the low and high extremes of body sizes, pregnant women, and vegetarians.)  Anion Gap 10  Magnesium, Serum  2.9 (1.8-2.4 THERAPEUTIC RANGE: 4-7 mg/dL TOXIC: > 10 mg/dL  -----------------------)  Routine Coag:  20-Jan-16 04:03   Prothrombin  15.7  INR 1.3 (INR reference interval applies to patients on anticoagulant therapy. A single INR therapeutic range for coumarins is not optimal for all indications; however, the suggested range for most indications is 2.0 - 3.0. Exceptions to the INR Reference Range may include: Prosthetic heart valves, acute myocardial infarction, prevention of myocardial infarction, and combinations of aspirin and anticoagulant. The need for a higher or lower target INR must be assessed individually. Reference: The Pharmacology and Management of the Vitamin K  antagonists: the seventh ACCP Conference on Antithrombotic and Thrombolytic Therapy. CWUGQB.1694Sept:126 (3suppl): [id number] A HCT value >55% may artifactually increase the PT.  In one study,  the increase was an average of 25%. Reference:  "Effect on Routine and Special Coagulation Testing Values of Citrate Anticoagulant Adjustment  in Patients with High HCT Values." American Journal of Clinical Pathology 2006;126:400-405.)  Routine Hem:  20-Jan-16 04:03   WBC (CBC) 7.5  RBC (CBC)  2.84  Hemoglobin (CBC)  7.7  Hematocrit (CBC)  24.2  Platelet Count (CBC) 215  MCV 85  MCH 27.1  MCHC 32.0  RDW  17.9  Neutrophil % 89.0  Lymphocyte % 2.5  Monocyte % 8.3  Eosinophil % 0.1  Basophil % 0.1  Neutrophil #  6.7   Lymphocyte #  0.2  Monocyte # 0.6  Eosinophil # 0.0  Basophil # 0.0 (Result(s) reported on 18 Nov 2014 at 05:34AM.)   Assessment/Plan:  Assessment/Plan:  Assessment Cholangitis: Doing well s/p biliary stent change this AM by Dr Allen Norris   Plan 1) continue supportive measures 2) stent change in 2 months 3) LFTS, lipase in AM Please call if you have any questions or concerns   Electronic Signatures: Andria Meuse (NP)  (Signed 20-Jan-16 13:02)  Authored: Chief Complaint, VITAL SIGNS/ANCILLARY NOTES, Brief Assessment, Lab Results, Assessment/Plan   Last Updated: 20-Jan-16 13:02 by Andria Meuse (NP)

## 2015-02-28 NOTE — Discharge Summary (Signed)
PATIENT NAME:  [name], Rayana K MR#:  [id number] DATE OF BIRTH:  03-28-1935  DATE OF ADMISSION:  11/17/2014 DATE OF DISCHARGE:  11/27/2014  DISCHARGE DIAGNOSES: 1. Sepsis secondary to acute cholangitis, status post common bile duct biliary stent replacement.  2. Metastatic endometrial cancer.  3. Chronic anemia.  4. Hypokalemia.  5. Adult-onset diabetes.   DISCHARGE MEDICATIONS: 1. Vitamin D2 at 50,000 international units weekly.  2. Metrazol 2.5 mg p.o. daily.  3. Gabapentin 100 mg p.o. b.i.d.  4. Glimepiride 4 mg p.o. b.i.d. with meals.  5. Pantoprazole 40 mg p.o. 1/2 hour before meals.  6. Potassium chloride 10 mEq p.o. b.i.d..  7. Magnesium oxide 400 mg p.o. t.i.d.  8. Januvia 100 mg p.o. daily.   CONSULTANTS: GI, oncology, ID.   PROCEDURES: The patient had common bile duct, biliary stent replacement x 2. Had a bare metal stent placed by Dr. Servando SnareWohl.   PERTINENT LABORATORIES AND STUDIES: On day of discharge, sodium 133, potassium 3.4, creatinine 0.79, glucose 146, AST 32, ALT 25, total bilirubin 2.7, alkaline phosphatase 449 hemoglobin of 8.   BRIEF HOSPITAL COURSE:  1. Sepsis with cholangitis. The patient initially came in with fever, leukocytosis, and jaundice, consistent with sepsis due to acute cholangitis, due to non-draining biliary duct. Was seen by Dr. Servando SnareWohl, underwent a procedure. Initially improved, but then worsened again. Had to go back in to do a bare metal stent replacement of the common bile duct, and she has since improved. Her color has improved. She has been afebrile. She has been on antibiotics this entire visit. Will discontinue antibiotics upon discharge after discussions with Dr. Sampson GoonFitzgerald. We will need to follow her enzymes as an outpatient, including her total bilirubin and alkaline phosphatase, and plan to do that within 10 days of discharge.  2. She does have metastatic disease to the liver, followed by Dr. Lorre NickGittin, as well. We will keep that follow-up  appointment with him.  3. Other chronic issues are stable at this time. Will continue with home medications.   DISPOSITION: She is in stable condition and will be discharged to home I'll see her in the clinic within 10 days   ____________________________ Marisue IvanKanhka Lashunta Frieden, MD kl:mw D: 11/27/2014 08:38:45 ET T: 11/27/2014 13:11:31 ET JOB#: [id number]  cc: Marisue IvanKanhka Danika Kluender, MD, <Dictator> Marisue IvanKANHKA Aisha Greenberger MD ELECTRONICALLY SIGNED 12/02/2014 14:21

## 2015-02-28 NOTE — Consult Note (Signed)
Chief Complaint:  Subjective/Chief Complaint Patient seen for Dr Servando SnareWohl.  Overall doing well, denies nausea or emesis.  no abdominal pain.  stools not as loose in ostomy bag.  Patietn notices some enlargement of the ostomy protrusion, but no pain.  tolerating light diet.  Only c/o is some chest discomfort with exertion.   VITAL SIGNS/ANCILLARY NOTES: **Vital Signs.:   23-Jan-16 05:21  Vital Signs Type Routine  Celsius 36.9  Temperature Source oral  Pulse Pulse 81  Respirations Respirations 18  Systolic BP Systolic BP 105  Diastolic BP (mmHg) Diastolic BP (mmHg) 58  Mean BP 73  Pulse Ox % Pulse Ox % 94  Pulse Ox Activity Level  At rest  Oxygen Delivery Room Air/ 21 %   Brief Assessment:  Cardiac Regular   Respiratory clear BS   Gastrointestinal details normal Soft  Nontender  Nondistended  Bowel sounds normal   Lab Results:  Hepatic:  19-Jan-16 18:06   Bilirubin, Total  3.3  Alkaline Phosphatase  572 (46-116 NOTE: New Reference Range 05/19/14)  20-Jan-16 04:03   Bilirubin, Total  2.8  Alkaline Phosphatase  442 (46-116 NOTE: New Reference Range 05/19/14)  21-Jan-16 03:53   Bilirubin, Total  2.6  Alkaline Phosphatase  427 (46-116 NOTE: New Reference Range 05/19/14)  22-Jan-16 04:00   Bilirubin, Total  3.8  Alkaline Phosphatase  475 (46-116 NOTE: New Reference Range 05/19/14)  23-Jan-16 05:32   Bilirubin, Total  4.9  Alkaline Phosphatase  523 (46-116 NOTE: New Reference Range 05/19/14)  Routine Micro:  22-Jan-16 13:00   Micro Text Report STOOL COMPREHENSIVE   COMMENT                   NO SALMONELLA OR SHIGELLA ISOLATED   COMMENT                   NO PATHOGENIC E.COLI DETECTED   COMMENT                   NO CAMPYLOBACTER ANTIGEN DETECTED   ANTIBIOTIC                        Micro Text Report CLOSTRIDIUM DIFFICILE   C.DIFFICILE ANTIGEN       C.DIFFICILE GDH ANTIGEN : NEGATIVE   C.DIFFICILE TOXIN A/B     C.DIFFICILE TOXINS A AND B : NEGATIVE   INTERPRETATION             Negative for C. difficile.    ANTIBIOTIC                        Culture Comment NO SALMONELLA OR SHIGELLA ISOLATED  Culture Comment . NO PATHOGENIC E.COLI DETECTED  Culture Comment    . NO CAMPYLOBACTER ANTIGEN DETECTED  Result(s) reported on 21 Nov 2014 at 11:53AM.  Routine Hem:  19-Jan-16 18:06   WBC (CBC)  11.3  20-Jan-16 04:03   WBC (CBC) 7.5  21-Jan-16 03:53   WBC (CBC) 9.0  22-Jan-16 04:00   WBC (CBC) 6.3  23-Jan-16 05:32   WBC (CBC) 5.8   Assessment/Plan:  Assessment/Plan:  Assessment 1) patietn admitted with probable sepsis, biliary source.  S/P ercp and stent exchange.   Some increase of bili and AP today, continue to have decreasing wbc.  no abdominal pain or fever.  2) chest discomfort on exertion. 3) diarrhea-improving.  ostomy appears healthy and pink.   Plan 1) repeat lfts in am, if continuing  to trend upward, may need repeat stent exchange.  continue current obvservation/labs. agree with possible tfx and evalution for chest discomfort on exertion.  Following.   Electronic Signatures: Barnetta Chapel (MD)  (Signed 23-Jan-16 12:44)  Authored: Chief Complaint, VITAL SIGNS/ANCILLARY NOTES, Brief Assessment, Lab Results, Assessment/Plan   Last Updated: 23-Jan-16 12:44 by Barnetta Chapel (MD)

## 2015-02-28 NOTE — Consult Note (Signed)
Chief Complaint:  Subjective/Chief Complaint Pt denies abdominal pain.  Nausea resolved today.  States she feels much better.  Good ostomy output.   VITAL SIGNS/ANCILLARY NOTES: **Vital Signs.:   28-Jan-16 04:44  Vital Signs Type Routine  Temperature Temperature (F) 98.7  Celsius 37  Temperature Source oral  Pulse Pulse 95  Respirations Respirations 20  Systolic BP Systolic BP 759  Diastolic BP (mmHg) Diastolic BP (mmHg) 57  Mean BP 76  Pulse Ox % Pulse Ox % 94  Pulse Ox Activity Level  At rest  Oxygen Delivery Room Air/ 21 %   Brief Assessment:  GEN no acute distress, thin, A/Ox3, family at bedside   Cardiac Regular   Respiratory normal resp effort   Gastrointestinal Normal   Gastrointestinal details normal Soft  Nontender  Nondistended  No masses palpable  Bowel sounds normal   EXTR negative cyanosis/clubbing, negative edema   Additional Physical Exam Skin: pale, warm, dry, mild jaundice HEENT: + mild scleral icterus   Lab Results: Hepatic:  28-Jan-16 03:56   Bilirubin, Direct  2.9 (Result(s) reported on 26 Nov 2014 at 05:05AM.)  Bilirubin, Total  4.2  Alkaline Phosphatase  608  SGPT (ALT) 38  SGOT (AST)  44  Total Protein, Serum  6.3  Albumin, Serum  1.9  Routine Chem:  28-Jan-16 03:56   BUN 7  Creatinine (comp) 0.81  Sodium, Serum 136  Potassium, Serum  3.3  Chloride, Serum  97  CO2, Serum 30  Calcium (Total), Serum 9.1  Osmolality (calc) 272  eGFR (African American) >60  eGFR (Non-African American) >60 (eGFR values <53m/min/1.73 m2 may be an indication of chronic kidney disease (CKD). Calculated eGFR, using the MRDR Study equation, is useful in  patients with stable renal function. The eGFR calculation will not be reliable in acutely ill patients when serum creatinine is changing rapidly. It is not useful in patients on dialysis. The eGFR calculation may not be applicable to patients at the low and high extremes of body sizes, pregnant women,  and vegetarians.)  Anion Gap 9    05:03   Result Comment LABS - This specimen was collected through an   - indwelling catheter or arterial line.  - A minimum of 562m of blood was wasted prior    - to collecting the sample.  Interpret  - results with caution.  Result(s) reported on 26 Nov 2014 at 06:50AM.  Routine Coag:  28-Jan-16 03:56   Prothrombin 12.6  INR 1.0 (INR reference interval applies to patients on anticoagulant therapy. A single INR therapeutic range for coumarins is not optimal for all indications; however, the suggested range for most indications is 2.0 - 3.0. Exceptions to the INR Reference Range may include: Prosthetic heart valves, acute myocardial infarction, prevention of myocardial infarction, and combinations of aspirin and anticoagulant. The need for a higher or lower target INR must be assessed individually. Reference: The Pharmacology and Management of the Vitamin K  antagonists: the seventh ACCP Conference on Antithrombotic and Thrombolytic Therapy. [id number].[id number]:126 (3suppl): [id number] HCT value >55% may artifactually increase the PT.  In one study,  the increase was an average of 25%. Reference:  "Effect on Routine and Special Coagulation Testing Values of Citrate Anticoagulant Adjustment in Patients with High HCT Values." American Journal of Clinical Pathology 2006;126:400-405.)  Routine Hem:  28-Jan-16 05:03   WBC (CBC) 10.7  RBC (CBC)  3.18  Hemoglobin (CBC)  8.9  Hematocrit (CBC)  26.6  Platelet Count (CBC) 273  MCV 84  MCH 28.1  MCHC 33.5  RDW  19.9  Neutrophil % 85.7  Lymphocyte % 4.2  Monocyte % 8.9  Eosinophil % 1.1  Basophil % 0.1  Neutrophil #  9.2  Lymphocyte #  0.5  Monocyte #  1.0  Eosinophil # 0.1  Basophil # 0.0   Assessment/Plan:  Assessment/Plan:  Assessment Obstructive jaundice secondary to stricture & metastatic disease s/p ERCP/metal stents:  Much improved from yesterday, nausea resolved.  In much better spirits.   LFTS improving.  INR remains normal so she has intact hepatic function.   Anemia of chronic disease: Hgb stable   Plan 1) continue supportive measures 2) appreciate oncology input 3) follow LFTS q1-2 days Please call if you have any questions or concerns   Electronic Signatures: Andria Meuse (NP)  (Signed 28-Jan-16 10:29)  Authored: Chief Complaint, VITAL SIGNS/ANCILLARY NOTES, Brief Assessment, Lab Results, Assessment/Plan   Last Updated: 28-Jan-16 10:29 by Andria Meuse (NP)

## 2015-02-28 NOTE — Consult Note (Signed)
Chief Complaint:  Subjective/Chief Complaint seen for biliary obstruction fu for Dr Allen Norris.  some mild increasing nausea today, no abdominal pain. no emesis, colostomy with soft unformed stool.   VITAL SIGNS/ANCILLARY NOTES: **Vital Signs.:   24-Jan-16 05:47  Temperature Temperature (F) 98.6  Celsius 37  Temperature Source oral  Pulse Pulse 83  Respirations Respirations 20  Systolic BP Systolic BP 366  Diastolic BP (mmHg) Diastolic BP (mmHg) 72  Mean BP 86  Pulse Ox % Pulse Ox % 94  Pulse Ox Activity Level  At rest  Oxygen Delivery Room Air/ 21 %  *Intake and Output.:   Shift 24-Jan-16 15:00  Length of Stay Totals Intake:  14486 Output:  10250    Net:  4236   Brief Assessment:  Cardiac Regular   Respiratory clear BS   Gastrointestinal details normal Soft  Nontender  Nondistended  Bowel sounds normal  ostomy with clay colored stool, soft not liquid.   Lab Results: Hepatic:  19-Jan-16 18:06   Bilirubin, Total  3.3  Alkaline Phosphatase  572 (46-116 NOTE: New Reference Range 05/19/14)  SGOT (AST)  208  20-Jan-16 04:03   Bilirubin, Total  2.8  Alkaline Phosphatase  442 (46-116 NOTE: New Reference Range 05/19/14)  SGOT (AST)  119  21-Jan-16 03:53   Bilirubin, Total  2.6  Bilirubin, Direct  2.2 (Result(s) reported on 19 Nov 2014 at 04:11AM.)  Alkaline Phosphatase  427 (46-116 NOTE: New Reference Range 05/19/14)  SGOT (AST)  79  22-Jan-16 04:00   Bilirubin, Total  3.8  Alkaline Phosphatase  475 (46-116 NOTE: New Reference Range 05/19/14)  SGOT (AST)  85  23-Jan-16 05:32   Bilirubin, Total  4.9  Alkaline Phosphatase  523 (46-116 NOTE: New Reference Range 05/19/14)  SGOT (AST)  84  24-Jan-16 05:57   Bilirubin, Total  7.7  Bilirubin, Direct  6.3  Alkaline Phosphatase  612 (46-116 NOTE: New Reference Range 05/19/14)  SGPT (ALT)  64 (14-63 NOTE: New Reference Range 05/19/14)  SGOT (AST)  83  Total Protein, Serum  5.3  Albumin, Serum  1.6  Routine Micro:   22-Jan-16 13:00   Micro Text Report STOOL COMPREHENSIVE   COMMENT                   NO SALMONELLA OR SHIGELLA ISOLATED   COMMENT                   NO PATHOGENIC E.COLI DETECTED   COMMENT                   NO CAMPYLOBACTER ANTIGEN DETECTED   ANTIBIOTIC                        Micro Text Report WBCS, STOOL   COMMENT                   NO RBC'S OR WBC'S SEEN   ANTIBIOTIC                       Micro Text Report CLOSTRIDIUM DIFFICILE   C.DIFFICILE ANTIGEN       C.DIFFICILE GDH ANTIGEN : NEGATIVE   C.DIFFICILE TOXIN A/B     C.DIFFICILE TOXINS A AND B : NEGATIVE   INTERPRETATION            Negative for C. difficile.    ANTIBIOTIC  Culture Comment NO SALMONELLA OR SHIGELLA ISOLATED  Culture Comment . NO PATHOGENIC E.COLI DETECTED  Culture Comment    . NO CAMPYLOBACTER ANTIGEN DETECTED  Comment .1. NO RBC'S OR WBC'S SEEN  Result(s) reported on 21 Nov 2014 at 01:55PM.  Routine Chem:  24-Jan-16 05:57   Glucose, Serum  158  BUN  4  Creatinine (comp) 0.86  Sodium, Serum 137  Potassium, Serum  3.4  Chloride, Serum 99  CO2, Serum 31  Calcium (Total), Serum 8.5  Anion Gap 7  Osmolality (calc) 274  eGFR (African American) >60  eGFR (Non-African American) >60 (eGFR values <60mL/min/1.73 m2 may be an indication of chronic kidney disease (CKD). Calculated eGFR, using the MRDR Study equation, is useful in  patients with stable renal function. The eGFR calculation will not be reliable in acutely ill patients when serum creatinine is changing rapidly. It is not useful in patients on dialysis. The eGFR calculation may not be applicable to patients at the low and high extremes of body sizes, pregnant women, and vegetarians.)  Result Comment LABS - This specimen was collected through an   - indwelling catheter or arterial line.  - A minimum of 5mls of blood was wasted prior    - to collecting the sample.  Interpret  - results with caution.  Result(s) reported on 22 Nov 2014 at 06:36AM.  Result Comment PLATELETS - FIBRIN STRANDS SEEN ON SMEAR. THIS MAY  - AFFECT THE PLATELET COUNT. THE ACTUAL  - NUMERICAL COUNT MAY BE SOMEWHAT HIGHER  - THAN THE REPORTED VALUE.  Result(s) reported on 22 Nov 2014 at 07:02AM.  Result Comment LABS - This specimen was collected through an   - indwelling catheter or arterial line.  - A minimum of 5mls of blood was wasted prior    - to collecting the sample.  Interpret  - results with caution.  Result(s) reported on 22 Nov 2014 at 06:36AM.  Routine Hem:  24-Jan-16 05:57   WBC (CBC) 7.4  RBC (CBC)  3.37  Hemoglobin (CBC)  9.3  Hematocrit (CBC)  28.0  Platelet Count (CBC) 260  MCV 83  MCH 27.5  MCHC 33.2  RDW  17.9  Bands 3  Segmented Neutrophils 84  Lymphocytes 4  Monocytes 6  Eosinophil 2  Myelocyte 1  Diff Comment 1 ANISOCYTOSIS  Diff Comment 2 POLYCHROMASIA  Diff Comment 3 DOHLE BODIES  Diff Comment 4 PLTS VARIED IN SIZE  Diff Comment 5 LARGE PLATELETS  Result(s) reported on 22 Nov 2014 at 07:02AM.   Assessment/Plan:  Assessment/Plan:  Assessment 1) obstructive jaundice-s/p ercp and stent placement for  biliary sepsis.  labs indicating possible recurrent obstruction. On abx. stable/afebrile. 2) diarrhea- resolved. cultures negative.  3) h/o endometrial Ca with mets to mesentery, s/o colostomy, DM2, htn, peripheral neuropathy, hyperlipidemia.   Plan 1) continue current. Discussed wtih Dr Wohl and Dr Anderson.  Probable repeat ERCP early in the weel with stent change. Dr Wohl to resume GI care tomorrow.   Electronic Signatures: Skulskie, Martin (MD)  (Signed 24-Jan-16 15:12)  Authored: Chief Complaint, VITAL SIGNS/ANCILLARY NOTES, Brief Assessment, Lab Results, Assessment/Plan   Last Updated: 24-Jan-16 15:12 by Skulskie, Martin (MD) 

## 2015-02-28 NOTE — Consult Note (Signed)
Chief Complaint:  Subjective/Chief Complaint Pt denies abdominal pain.  Denies nausea or vomiting.  More jaundiced today.  Good ostomy output.   VITAL SIGNS/ANCILLARY NOTES: **Vital Signs.:   25-Jan-16 13:56  Vital Signs Type Routine  Temperature Temperature (F) 98.6  Celsius 37  Temperature Source oral  Pulse Pulse 84  Respirations Respirations 20  Systolic BP Systolic BP 503  Diastolic BP (mmHg) Diastolic BP (mmHg) 71  Mean BP 85  Pulse Ox % Pulse Ox % 96  Pulse Ox Activity Level  At rest  Oxygen Delivery Room Air/ 21 %   Brief Assessment:  GEN no acute distress, thin, A/Ox3, family at bedside   Cardiac Regular   Respiratory normal resp effort   Gastrointestinal Normal   Gastrointestinal details normal Soft  Nontender  Nondistended  No masses palpable  Bowel sounds normal   EXTR negative cyanosis/clubbing, negative edema   Additional Physical Exam Skin: pale, warm, dry, mild jaundice HEENT: +scleral icterus   Lab Results: Hepatic:  25-Jan-16 04:09   Bilirubin, Total  7.6  Bilirubin, Direct  6.2 (Result(s) reported on 23 Nov 2014 at 04:37AM.)  Alkaline Phosphatase  598 (46-116 NOTE: New Reference Range 05/19/14)  SGPT (ALT) 49 (14-63 NOTE: New Reference Range 05/19/14)  SGOT (AST)  68  Total Protein, Serum  5.0  Albumin, Serum  1.5  Routine Chem:  25-Jan-16 04:09   Glucose, Serum  132  BUN  5  Creatinine (comp) 0.83  Sodium, Serum 139  Potassium, Serum  3.3  Chloride, Serum 100  CO2, Serum 32  Calcium (Total), Serum  8.4  Anion Gap 7  Osmolality (calc) 277  eGFR (African American) >60  eGFR (Non-African American) >60 (eGFR values <80m/min/1.73 m2 may be an indication of chronic kidney disease (CKD). Calculated eGFR, using the MRDR Study equation, is useful in  patients with stable renal function. The eGFR calculation will not be reliable in acutely ill patients when serum creatinine is changing rapidly. It is not useful in patients on dialysis.  The eGFR calculation may not be applicable to patients at the low and high extremes of body sizes, pregnant women, and vegetarians.)  Routine Hem:  25-Jan-16 04:09   WBC (CBC) 6.9  RBC (CBC)  3.27  Hemoglobin (CBC)  9.1  Hematocrit (CBC)  27.0  Platelet Count (CBC) 256  MCV 83  MCH 27.9  MCHC 33.7  RDW  17.8  Neutrophil % 75.4  Lymphocyte % 11.4  Monocyte % 11.4  Eosinophil % 1.4  Basophil % 0.4  Neutrophil # 5.2  Lymphocyte #  0.8  Monocyte # 0.8  Eosinophil # 0.1  Basophil # 0.0 (Result(s) reported on 23 Nov 2014 at 04:37AM.)   Assessment/Plan:  Assessment/Plan:  Assessment Cholangitis: On antibiotics_appreciate ID management.  Will need biliary stent change to bare metal stent tomorrow.  ERCP with Dr WAllen Norris  Discussed risks/benefits of procedure which include but are not limited to pancreatitis, bleeding, infection, perforation & drug reaction.  Patient agrees with this plan & consent will be obtained.  Anemia of chronic disease: Hgb stable   Plan 1) continue supportive measures 2) bare metal stent change tomorrow with ERCP with Dr WAllen Norris3) LFTS in AM Please call if you have any questions or concerns   Electronic Signatures: JAndria Meuse(NP)  (Signed 25-Jan-16 17:42)  Authored: Chief Complaint, VITAL SIGNS/ANCILLARY NOTES, Brief Assessment, Lab Results, Assessment/Plan   Last Updated: 25-Jan-16 17:42 by JAndria Meuse(NP)

## 2015-02-28 DEATH — deceased

## 2015-03-22 ENCOUNTER — Telehealth: Payer: Self-pay | Admitting: *Deleted

## 2015-03-22 NOTE — Telephone Encounter (Signed)
Patient expired on Jun 14, 2015 and she wanted to thank everyone for the great care her mother received while here

## 2015-08-12 IMAGING — CT CT ABD-PELV W/ CM
2 of 5 series · 16 of 46 positions shown, 18 images · IV contrast (isovue)
Comparison: Abdominal radiograph November 16, 2013 and CT of the
chest, abdomen and pelvis January 29, 2012

CLINICAL DATA: Dehydration, continued nausea and vomiting with
abdominal pain. History of endometrial cancer, appendectomy,
cholecystectomy.

EXAM:
CT ABDOMEN AND PELVIS WITH CONTRAST
TECHNIQUE: Multidetector CT imaging of the abdomen and pelvis was performed
using the standard protocol following bolus administration of
intravenous contrast.
CONTRAST:  125 cc of Isovue 370

[Series 2: routine abd pel with · axial · 0.72mm/px · z∈[+526,+930]mm · 13 of 93 slices shown, 15 images]
[im 6/93  soft-tissue]
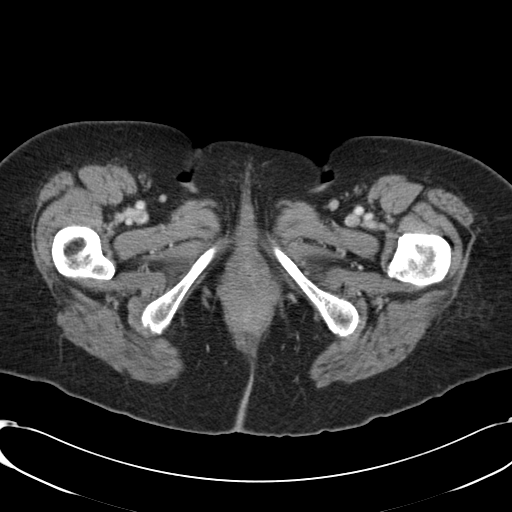
[im 6/93  bone]
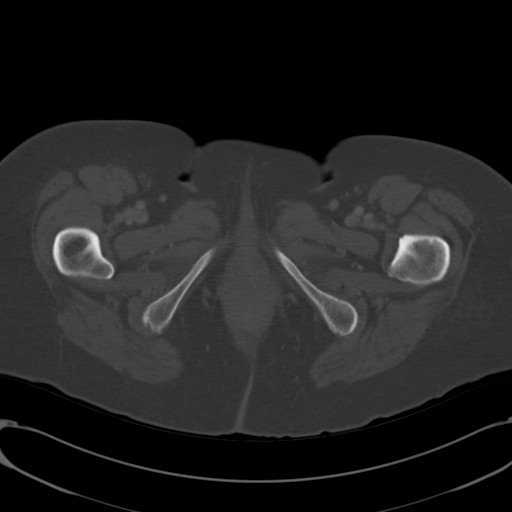
[im 11/93  soft-tissue]
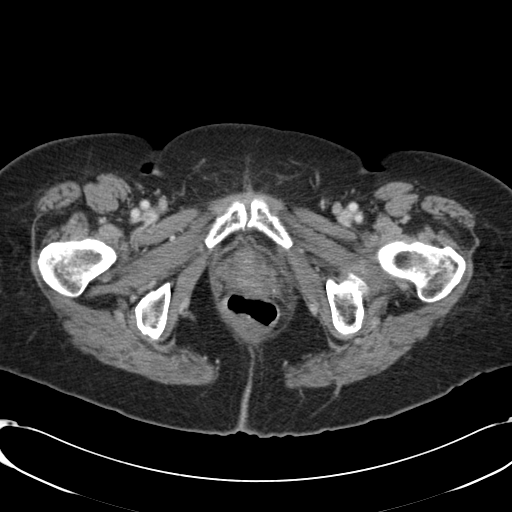
[im 22/93  soft-tissue]
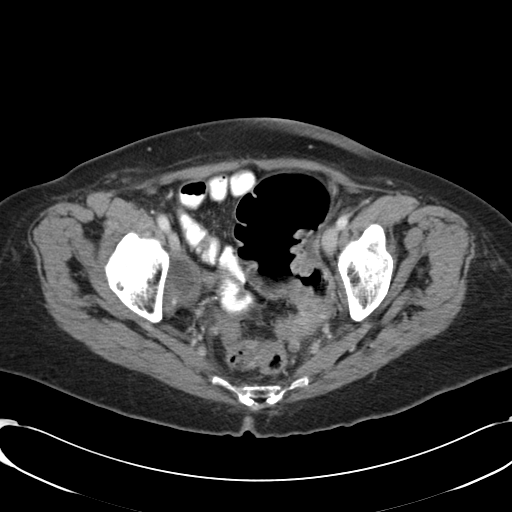
[im 28/93  soft-tissue]
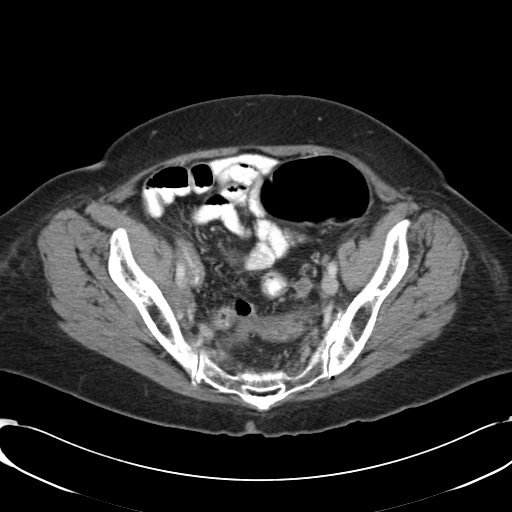
[im 33/93  soft-tissue]
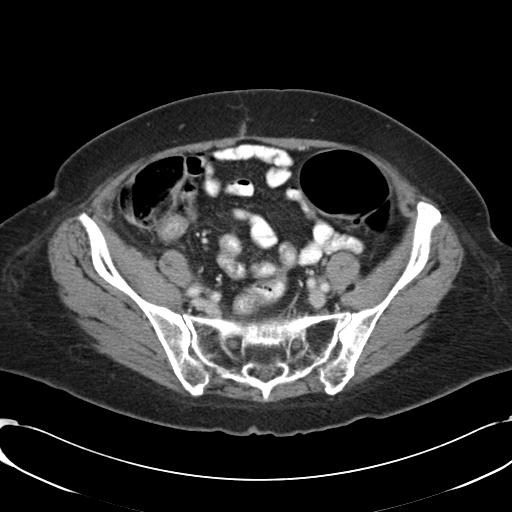
[im 38/93  soft-tissue]
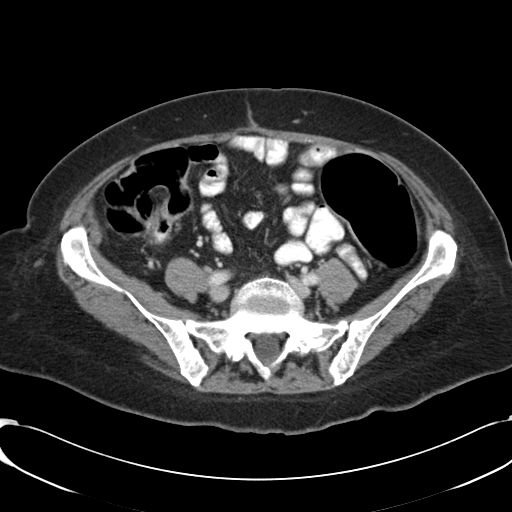
[im 49/93  soft-tissue]
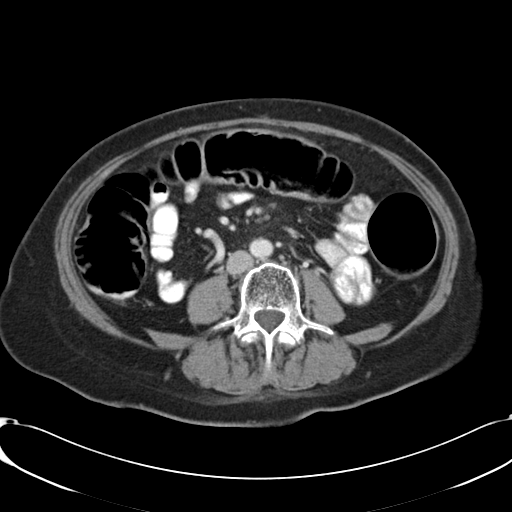
[im 55/93  soft-tissue]
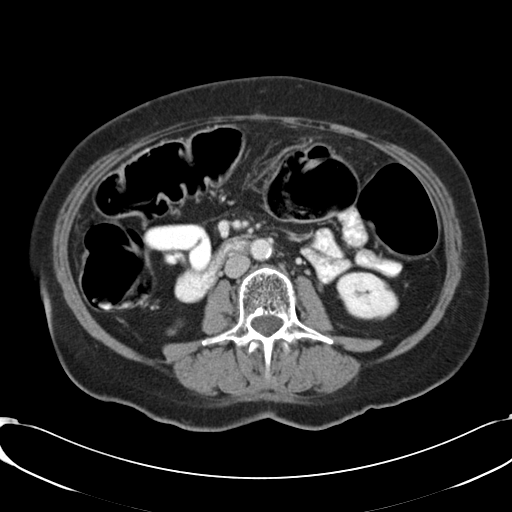
[im 60/93  soft-tissue]
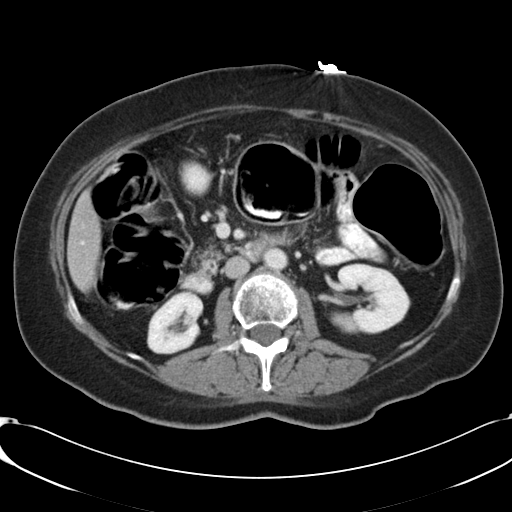
[im 60/93  bone]
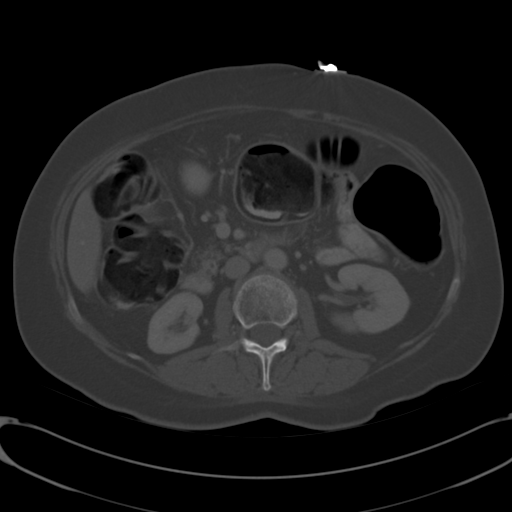
[im 65/93  soft-tissue]
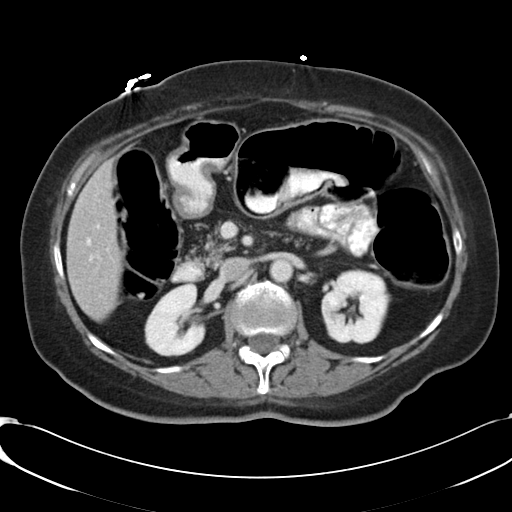
[im 71/93  soft-tissue]
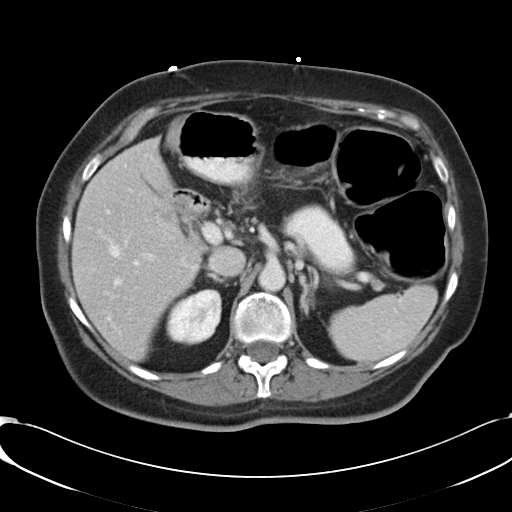
[im 82/93  soft-tissue]
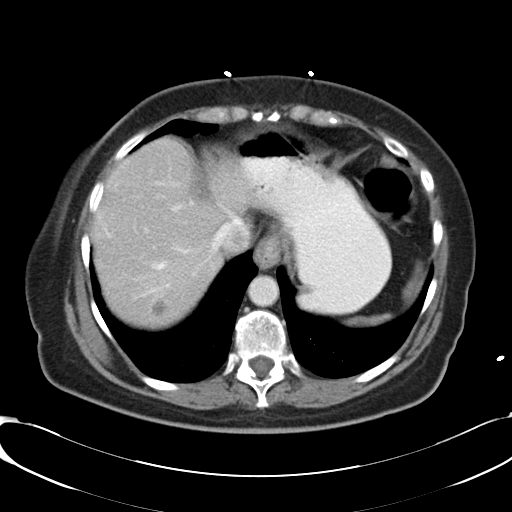
[im 87/93  soft-tissue]
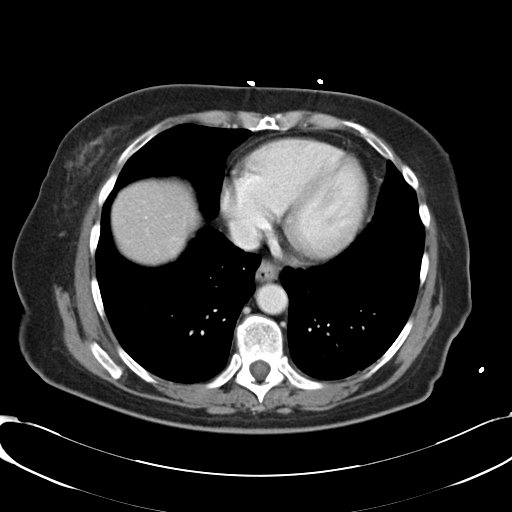

[Series 5: cor routine abd pel with · coronal · 0.96mm/px · 3 of 117 slices shown]
[im 39/117  soft-tissue]
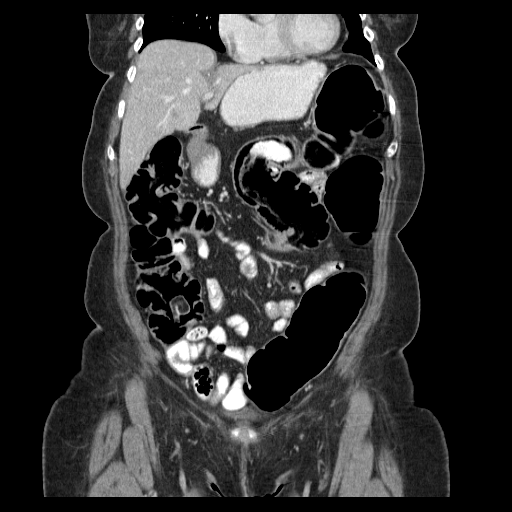
[im 52/117  soft-tissue]
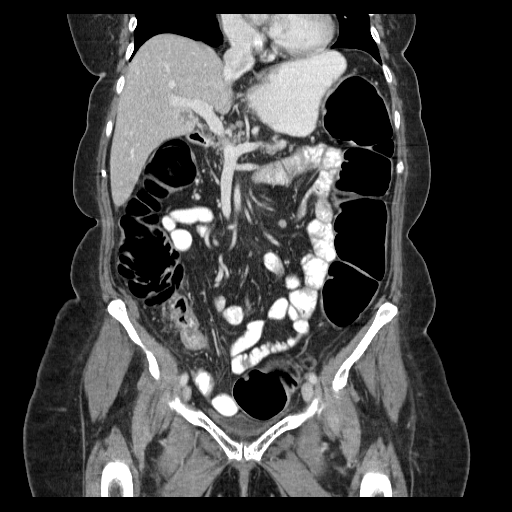
[im 65/117  soft-tissue]
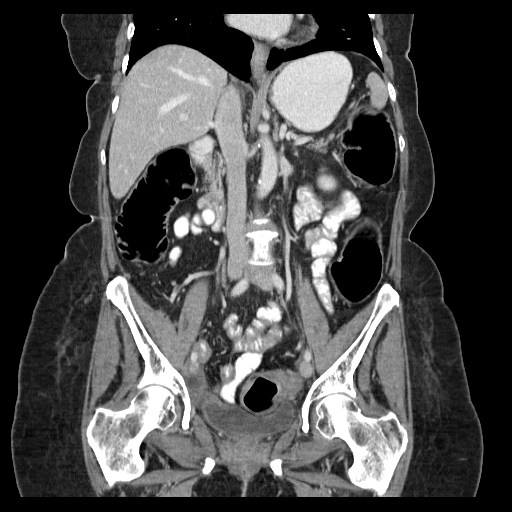

[16 of 46 positions shown; findings below may reference images not displayed]

FINDINGS: Limited view of the lung bases are clear. Visualized heart and
pericardium are unremarkable.

Very small hiatal hernia. Stomach is mildly distended with contrast
and otherwise unremarkable. There is an approximate 7 cm segment of
distal colon wall thickening. No pericolonic inflammatory changes.
Above this level, the colon is distended with gas and stool. Small
bowel is normal in course and caliber. Very mild thickened
appearance of the terminal ileum. Within the left pelvis, contiguous
with the colonic wall thickening is a sub cm nodule.

Ill-defined hypodense 28 x 30 mm mass in the right lobe of the
liver, segment 5, axial 21/93. A nonspecific 8 mm hypodensity in the
right lobe of the liver, segment 7 is too small to characterize. In
addition, hypodense ill-defined mass in the splenic hilum measures
20 x 21 mm. An an ill-defined mass within the spleen measures 22 x
23 mm.

Status post cholecystectomy. Atrophic pancreas, adrenal glands are
unremarkable.

Too small to characterize hypodensity in the kidneys bilaterally,
without nephrolithiasis, hydronephrosis. Prompt symmetric excretion
of contrast into the urinary collecting system on delayed imaging.
Abdominal aorta is overall normal course and caliber with mild
calcific atherosclerosis. Urinary bladder is partially distended and
otherwise unremarkable. Status post hysterectomy.

Laparotomy scar again seen, however there is a new 12 x 20 mm
hypodense ill-defined nodules within the anterior abdominal wall
underlying the scar. Ill-defined 20 x 15 mm mid mesenteric nodule,
axial 47/93. Necrotic 17 x 25 mm. External iliac chain nodal mass,
axial 71/93. Osteopenia without destructive bony lesions, sclerotic
focus in vertebral body L3 was present previously.
IMPRESSION: 7 cm segment of circumferential distal colon wall thickening,
without inflammatory changes resulting in distal colonic bowel
obstruction. Given the additional findings, this is concerning for
primary colonic neoplasm.

Ill-defined masses within the liver and spleen, as well as mass
within a splenic hilum splenule most consistent with metastatic
disease. New mesentery/omental masses concerning for metastatic
disease, in addition to a necrotic right external iliac chain nodal
conglomeration.

Findings discussed with and reconfirmed by Dr. Kwong Kin on November 19, 2013 at approximate 7047 hr.

  By: Gaby Juan Majito
